# Patient Record
Sex: Female | Born: 1945 | Race: White | Hispanic: No | Marital: Married | Smoking: Never smoker
Health system: Southern US, Community
[De-identification: ages and names within clinical notes are randomized; demographics above are authoritative.]

## PROBLEM LIST (undated history)

## (undated) DIAGNOSIS — E785 Hyperlipidemia, unspecified: Secondary | ICD-10-CM

## (undated) DIAGNOSIS — R202 Paresthesia of skin: Secondary | ICD-10-CM

## (undated) DIAGNOSIS — M359 Systemic involvement of connective tissue, unspecified: Secondary | ICD-10-CM

## (undated) DIAGNOSIS — F329 Major depressive disorder, single episode, unspecified: Secondary | ICD-10-CM

## (undated) DIAGNOSIS — M3130 Wegener's granulomatosis without renal involvement: Secondary | ICD-10-CM

## (undated) DIAGNOSIS — G2581 Restless legs syndrome: Secondary | ICD-10-CM

## (undated) DIAGNOSIS — R200 Anesthesia of skin: Secondary | ICD-10-CM

## (undated) DIAGNOSIS — N189 Chronic kidney disease, unspecified: Secondary | ICD-10-CM

## (undated) DIAGNOSIS — K635 Polyp of colon: Secondary | ICD-10-CM

## (undated) DIAGNOSIS — I10 Essential (primary) hypertension: Secondary | ICD-10-CM

## (undated) DIAGNOSIS — M1990 Unspecified osteoarthritis, unspecified site: Secondary | ICD-10-CM

## (undated) DIAGNOSIS — Z8489 Family history of other specified conditions: Secondary | ICD-10-CM

## (undated) DIAGNOSIS — R269 Unspecified abnormalities of gait and mobility: Secondary | ICD-10-CM

## (undated) DIAGNOSIS — E7800 Pure hypercholesterolemia, unspecified: Secondary | ICD-10-CM

## (undated) DIAGNOSIS — I776 Arteritis, unspecified: Principal | ICD-10-CM

## (undated) DIAGNOSIS — J069 Acute upper respiratory infection, unspecified: Secondary | ICD-10-CM

## (undated) DIAGNOSIS — Z7409 Other reduced mobility: Secondary | ICD-10-CM

## (undated) DIAGNOSIS — F32A Depression, unspecified: Secondary | ICD-10-CM

## (undated) DIAGNOSIS — G63 Polyneuropathy in diseases classified elsewhere: Secondary | ICD-10-CM

## (undated) HISTORY — DX: Pure hypercholesterolemia, unspecified: E78.00

## (undated) HISTORY — PX: POLYPECTOMY: SHX149

## (undated) HISTORY — DX: Hyperlipidemia, unspecified: E78.5

## (undated) HISTORY — DX: Arteritis, unspecified: I77.6

## (undated) HISTORY — DX: Systemic involvement of connective tissue, unspecified: M35.9

## (undated) HISTORY — DX: Polyneuropathy in diseases classified elsewhere: G63

## (undated) HISTORY — DX: Depression, unspecified: F32.A

## (undated) HISTORY — DX: Wegener's granulomatosis without renal involvement: M31.30

## (undated) HISTORY — DX: Polyp of colon: K63.5

## (undated) HISTORY — DX: Unspecified abnormalities of gait and mobility: R26.9

## (undated) HISTORY — DX: Chronic kidney disease, unspecified: N18.9

## (undated) HISTORY — DX: Essential (primary) hypertension: I10

## (undated) HISTORY — PX: COLONOSCOPY: SHX174

## (undated) HISTORY — DX: Major depressive disorder, single episode, unspecified: F32.9

---

## 1997-11-15 ENCOUNTER — Other Ambulatory Visit: Admission: RE | Admit: 1997-11-15 | Discharge: 1997-11-15 | Payer: Self-pay | Admitting: Obstetrics and Gynecology

## 1999-02-18 ENCOUNTER — Other Ambulatory Visit: Admission: RE | Admit: 1999-02-18 | Discharge: 1999-02-18 | Payer: Self-pay | Admitting: Family Medicine

## 2002-05-23 ENCOUNTER — Other Ambulatory Visit: Admission: RE | Admit: 2002-05-23 | Discharge: 2002-05-23 | Payer: Self-pay | Admitting: Obstetrics and Gynecology

## 2003-08-06 ENCOUNTER — Other Ambulatory Visit: Admission: RE | Admit: 2003-08-06 | Discharge: 2003-08-06 | Payer: Self-pay | Admitting: Obstetrics & Gynecology

## 2005-02-11 ENCOUNTER — Other Ambulatory Visit: Admission: RE | Admit: 2005-02-11 | Discharge: 2005-02-11 | Payer: Self-pay | Admitting: Radiology

## 2005-07-02 ENCOUNTER — Ambulatory Visit (HOSPITAL_BASED_OUTPATIENT_CLINIC_OR_DEPARTMENT_OTHER): Admission: RE | Admit: 2005-07-02 | Discharge: 2005-07-02 | Payer: Self-pay | Admitting: Surgery

## 2005-07-02 ENCOUNTER — Encounter (INDEPENDENT_AMBULATORY_CARE_PROVIDER_SITE_OTHER): Payer: Self-pay | Admitting: *Deleted

## 2006-09-06 ENCOUNTER — Ambulatory Visit: Payer: Self-pay | Admitting: Internal Medicine

## 2006-09-30 ENCOUNTER — Ambulatory Visit: Payer: Self-pay | Admitting: Internal Medicine

## 2006-09-30 ENCOUNTER — Encounter: Payer: Self-pay | Admitting: Internal Medicine

## 2010-10-03 NOTE — Op Note (Signed)
NAME:  Kristie Porter, Kristie Porter               ACCOUNT NO.:  [id number]   MEDICAL RECORD NO.:  [id number]          PATIENT TYPE:  AMB   LOCATION:  DSC                          FACILITY:  [facility]   PHYSICIAN:  Currie Paris, M.D.DATE OF BIRTH:  [birth year]   DATE OF PROCEDURE:  07/02/2005  DATE OF DISCHARGE:                                 OPERATIVE REPORT   PREOPERATIVE DIAGNOSIS:  Left breast cyst with some atypia on aspiration.   POSTOPERATIVE DIAGNOSIS:  Left breast cyst with some atypia on aspiration.   DESCRIPTION OF PROCEDURE:  Excision of left breast cyst.   SURGEON:  Currie Paris, M.D.   ANESTHESIA:  General.   CLINICAL HISTORY:  This is a 65 year old who had her left breast cyst just  aspirated and it subsequently reaccumulated. An FNA had shown some apical  metaplasia with cytologic atypia. Excisional biopsy was recommended.   DESCRIPTION OF PROCEDURE:  The patient was seen in the holding area and  shehad no further questions. She was taken to the operating room, and the  mass was identified both by palpation and by ultrasound. It was marked. She  then underwent satisfactory general anesthesia.   The breast was prepped and draped. The time-out occurred.   I injected some Marcaine to help with postoperative analgesia. I made an  incision from about the 10 o'clock to the 12 o'clock position, about a  centimeter above the areolar margin. I divided a little of the subcutaneous  tissue, and by palpation started to go around the mass superiorly, then a  little bit laterally and a little bit medially. Unfortunately the mass  disrupted with a little bit of pressure, and I suctioned out some old  brownish fluid. The mass was then excised to get the entire cyst wall; this  had to be somewhat fragmented, because once the cyst disrupted it was  difficult to get good grasp on it and do it by palpation. Nevertheless, I  got it completely out and I felt like all the edges of the  tissue left  behind were all normal breast tissue and fatty tissue.   I injected some more Marcaine deep. I made sure everything was dry. I then  closed in layers with several layers of 3-0 Vicryl, and finally 4-0 Monocryl  subcuticular plus Dermabond.   The patient tolerated the procedure well. There were no operative  complications and all counts were correct.      Currie Paris, M.D.  Electronically Signed     CJS/MEDQ  D:  07/02/2005  T:  07/02/2005  Job:  [id number]   cc:   Erskine Speed, M.D.  Fax: [phone]   Jasmine Pang  Fax: [phone]

## 2010-12-24 ENCOUNTER — Other Ambulatory Visit: Payer: Self-pay | Admitting: Internal Medicine

## 2010-12-24 DIAGNOSIS — Z780 Asymptomatic menopausal state: Secondary | ICD-10-CM

## 2011-01-20 ENCOUNTER — Other Ambulatory Visit: Payer: Self-pay

## 2011-02-16 ENCOUNTER — Ambulatory Visit
Admission: RE | Admit: 2011-02-16 | Discharge: 2011-02-16 | Disposition: A | Discharge status: Home / Self Care | Payer: BC Managed Care – PPO | Source: Ambulatory Visit | Attending: Internal Medicine | Admitting: Internal Medicine

## 2011-02-16 DIAGNOSIS — Z780 Asymptomatic menopausal state: Secondary | ICD-10-CM

## 2013-03-13 ENCOUNTER — Other Ambulatory Visit: Payer: Self-pay | Admitting: Internal Medicine

## 2013-03-13 ENCOUNTER — Ambulatory Visit
Admission: RE | Admit: 2013-03-13 | Discharge: 2013-03-13 | Disposition: A | Discharge status: Home / Self Care | Payer: Medicare Other | Source: Ambulatory Visit | Attending: Internal Medicine | Admitting: Internal Medicine

## 2013-03-13 DIAGNOSIS — R52 Pain, unspecified: Secondary | ICD-10-CM

## 2013-08-03 ENCOUNTER — Encounter: Payer: Self-pay | Admitting: Internal Medicine

## 2014-05-18 DIAGNOSIS — J069 Acute upper respiratory infection, unspecified: Secondary | ICD-10-CM

## 2014-05-18 HISTORY — DX: Acute upper respiratory infection, unspecified: J06.9

## 2014-07-16 ENCOUNTER — Other Ambulatory Visit: Payer: Self-pay | Admitting: Internal Medicine

## 2014-07-16 DIAGNOSIS — H938X3 Other specified disorders of ear, bilateral: Secondary | ICD-10-CM

## 2014-07-16 DIAGNOSIS — H9193 Unspecified hearing loss, bilateral: Secondary | ICD-10-CM

## 2014-07-17 ENCOUNTER — Ambulatory Visit
Admission: RE | Admit: 2014-07-17 | Discharge: 2014-07-17 | Disposition: A | Discharge status: Home / Self Care | Payer: Medicare Other | Source: Ambulatory Visit | Attending: Internal Medicine | Admitting: Internal Medicine

## 2014-07-17 DIAGNOSIS — H938X3 Other specified disorders of ear, bilateral: Secondary | ICD-10-CM

## 2014-07-17 DIAGNOSIS — H9193 Unspecified hearing loss, bilateral: Secondary | ICD-10-CM

## 2014-09-16 DIAGNOSIS — Z7409 Other reduced mobility: Secondary | ICD-10-CM

## 2014-09-16 HISTORY — DX: Other reduced mobility: Z74.09

## 2014-09-26 ENCOUNTER — Inpatient Hospital Stay (HOSPITAL_COMMUNITY)
Admission: AD | Admit: 2014-09-26 | Discharge: 2014-10-03 | DRG: 042 | Disposition: A | Discharge status: Skilled Nursing Facility | Payer: Medicare Other | Source: Ambulatory Visit | Attending: Internal Medicine | Admitting: Internal Medicine

## 2014-09-26 ENCOUNTER — Encounter (HOSPITAL_COMMUNITY): Payer: Self-pay | Admitting: General Practice

## 2014-09-26 ENCOUNTER — Inpatient Hospital Stay (HOSPITAL_COMMUNITY): Payer: Medicare Other

## 2014-09-26 DIAGNOSIS — R29898 Other symptoms and signs involving the musculoskeletal system: Secondary | ICD-10-CM | POA: Diagnosis not present

## 2014-09-26 DIAGNOSIS — R599 Enlarged lymph nodes, unspecified: Secondary | ICD-10-CM

## 2014-09-26 DIAGNOSIS — R209 Unspecified disturbances of skin sensation: Secondary | ICD-10-CM

## 2014-09-26 DIAGNOSIS — D649 Anemia, unspecified: Secondary | ICD-10-CM | POA: Diagnosis present

## 2014-09-26 DIAGNOSIS — M25531 Pain in right wrist: Secondary | ICD-10-CM | POA: Diagnosis present

## 2014-09-26 DIAGNOSIS — G629 Polyneuropathy, unspecified: Principal | ICD-10-CM | POA: Diagnosis present

## 2014-09-26 DIAGNOSIS — T380X5A Adverse effect of glucocorticoids and synthetic analogues, initial encounter: Secondary | ICD-10-CM | POA: Diagnosis present

## 2014-09-26 DIAGNOSIS — G2581 Restless legs syndrome: Secondary | ICD-10-CM | POA: Diagnosis present

## 2014-09-26 DIAGNOSIS — R531 Weakness: Secondary | ICD-10-CM | POA: Diagnosis present

## 2014-09-26 DIAGNOSIS — R208 Other disturbances of skin sensation: Secondary | ICD-10-CM | POA: Diagnosis not present

## 2014-09-26 DIAGNOSIS — I10 Essential (primary) hypertension: Secondary | ICD-10-CM | POA: Diagnosis present

## 2014-09-26 DIAGNOSIS — R591 Generalized enlarged lymph nodes: Secondary | ICD-10-CM

## 2014-09-26 DIAGNOSIS — G609 Hereditary and idiopathic neuropathy, unspecified: Secondary | ICD-10-CM | POA: Diagnosis not present

## 2014-09-26 DIAGNOSIS — R292 Abnormal reflex: Secondary | ICD-10-CM

## 2014-09-26 HISTORY — DX: Acute upper respiratory infection, unspecified: J06.9

## 2014-09-26 HISTORY — DX: Anesthesia of skin: R20.2

## 2014-09-26 HISTORY — DX: Unspecified osteoarthritis, unspecified site: M19.90

## 2014-09-26 HISTORY — DX: Family history of other specified conditions: Z84.89

## 2014-09-26 HISTORY — DX: Restless legs syndrome: G25.81

## 2014-09-26 HISTORY — DX: Anesthesia of skin: R20.0

## 2014-09-26 HISTORY — DX: Other reduced mobility: Z74.09

## 2014-09-26 LAB — COMPREHENSIVE METABOLIC PANEL
ALBUMIN: 2.4 g/dL — AB (ref 3.5–5.0)
ALT: 24 U/L (ref 14–54)
AST: 63 U/L — AB (ref 15–41)
Alkaline Phosphatase: 114 U/L (ref 38–126)
Anion gap: 13 (ref 5–15)
BUN: 14 mg/dL (ref 6–20)
CALCIUM: 9 mg/dL (ref 8.9–10.3)
CO2: 27 mmol/L (ref 22–32)
Chloride: 97 mmol/L — ABNORMAL LOW (ref 101–111)
Creatinine, Ser: 0.94 mg/dL (ref 0.44–1.00)
GFR calc Af Amer: >60 mL/min (ref >60)
Glucose, Bld: 136 mg/dL — ABNORMAL HIGH (ref 70–99)
Potassium: 4.4 mmol/L (ref 3.5–5.1)
Sodium: 137 mmol/L (ref 135–145)
Total Bilirubin: 0.7 mg/dL (ref 0.3–1.2)
Total Protein: 7.4 g/dL (ref 6.5–8.1)

## 2014-09-26 LAB — CBC WITH DIFFERENTIAL/PLATELET
BASOS ABS: 0 10*3/uL (ref 0.0–0.1)
Basophils Relative: 0 % (ref 0–1)
EOS PCT: 0 % (ref 0–5)
Eosinophils Absolute: 0 10*3/uL (ref 0.0–0.7)
HEMATOCRIT: 32.7 % — AB (ref 36.0–46.0)
Hemoglobin: 10.2 g/dL — ABNORMAL LOW (ref 12.0–15.0)
LYMPHS PCT: 13 % (ref 12–46)
Lymphs Abs: 1.3 10*3/uL (ref 0.7–4.0)
MCH: 25.7 pg — ABNORMAL LOW (ref 26.0–34.0)
MCHC: 31.2 g/dL (ref 30.0–36.0)
MCV: 82.4 fL (ref 78.0–100.0)
Monocytes Absolute: 0.4 10*3/uL (ref 0.1–1.0)
Monocytes Relative: 4 % (ref 3–12)
NEUTROS ABS: 7.8 10*3/uL — AB (ref 1.7–7.7)
Neutrophils Relative %: 83 % — ABNORMAL HIGH (ref 43–77)
Platelets: 455 10*3/uL — ABNORMAL HIGH (ref 150–400)
RBC: 3.97 MIL/uL (ref 3.87–5.11)
RDW: 14.6 % (ref 11.5–15.5)
WBC: 9.4 10*3/uL (ref 4.0–10.5)

## 2014-09-26 LAB — SEDIMENTATION RATE: Sed Rate: 105 mm/hr — ABNORMAL HIGH (ref 0–22)

## 2014-09-26 LAB — VITAMIN B12: Vitamin B-12: 258 pg/mL (ref 180–914)

## 2014-09-26 LAB — C-REACTIVE PROTEIN: CRP: 32.3 mg/dL — AB (ref <1.0)

## 2014-09-26 LAB — TSH: TSH: 1.279 u[IU]/mL (ref 0.350–4.500)

## 2014-09-26 LAB — CK: Total CK: 1997 U/L — ABNORMAL HIGH (ref 38–234)

## 2014-09-26 MED ORDER — GABAPENTIN 600 MG PO TABS
300.0000 mg | ORAL_TABLET | Freq: Three times a day (TID) | ORAL | Status: DC | PRN
  Administered 2014-09-29: 300 mg via ORAL
  Filled 2014-09-26: qty 1

## 2014-09-26 MED ORDER — GABAPENTIN 300 MG PO CAPS
300.0000 mg | ORAL_CAPSULE | Freq: Three times a day (TID) | ORAL | Status: DC
  Administered 2014-09-26 – 2014-09-30 (×12): 300 mg via ORAL
  Filled 2014-09-26 (×11): qty 1

## 2014-09-26 MED ORDER — ACETAMINOPHEN 650 MG RE SUPP: 650.0000 mg | Freq: Four times a day (QID) | RECTAL | Status: DC | PRN

## 2014-09-26 MED ORDER — HYDROCODONE-ACETAMINOPHEN 5-325 MG PO TABS
1.0000 | ORAL_TABLET | ORAL | Status: DC | PRN
  Administered 2014-09-26 – 2014-10-03 (×29): 2 via ORAL
  Filled 2014-09-26 (×30): qty 2

## 2014-09-26 MED ORDER — ONDANSETRON HCL 4 MG PO TABS: 4.0000 mg | ORAL_TABLET | Freq: Four times a day (QID) | ORAL | Status: DC | PRN

## 2014-09-26 MED ORDER — ONDANSETRON HCL 4 MG/2ML IJ SOLN: 4.0000 mg | Freq: Four times a day (QID) | INTRAMUSCULAR | Status: DC | PRN

## 2014-09-26 MED ORDER — ENOXAPARIN SODIUM 40 MG/0.4ML ~~LOC~~ SOLN
40.0000 mg | SUBCUTANEOUS | Status: DC
  Administered 2014-09-26 – 2014-10-02 (×7): 40 mg via SUBCUTANEOUS
  Filled 2014-09-26 (×8): qty 0.4

## 2014-09-26 MED ORDER — ACETAMINOPHEN 325 MG PO TABS: 650.0000 mg | ORAL_TABLET | Freq: Four times a day (QID) | ORAL | Status: DC | PRN
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
## 2014-09-26 NOTE — Progress Notes (Signed)
Patient present on floor. No orders written as of yet. Will continue to monitor.
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
## 2014-09-27 ENCOUNTER — Inpatient Hospital Stay (HOSPITAL_COMMUNITY): Payer: Medicare Other

## 2014-09-27 LAB — PROTEIN ELECTROPHORESIS, SERUM
A/G RATIO SPE: 0.6 — AB (ref 0.7–2.0)
ALBUMIN ELP: 2.5 g/dL — AB (ref 3.2–5.6)
Alpha-1-Globulin: 0.7 g/dL — ABNORMAL HIGH (ref 0.1–0.4)
Alpha-2-Globulin: 1.4 g/dL — ABNORMAL HIGH (ref 0.4–1.2)
BETA GLOBULIN: 1.6 g/dL — AB (ref 0.6–1.3)
GAMMA GLOBULIN: 0.6 g/dL (ref 0.5–1.6)
GLOBULIN, TOTAL: 4.4 g/dL (ref 2.0–4.5)
M-SPIKE, %: 0.8 g/dL — AB
Total Protein ELP: 6.9 g/dL (ref 6.0–8.5)

## 2014-09-27 LAB — CK TOTAL AND CKMB (NOT AT ARMC)
CK TOTAL: 656 U/L — AB (ref 38–234)
CK TOTAL: 510 U/L — AB (ref 38–234)
CK, MB: 4.1 ng/mL (ref 0.5–5.0)
CK, MB: 3.4 ng/mL (ref 0.5–5.0)
Relative Index: 0.6 (ref 0.0–2.5)
Relative Index: 0.7 (ref 0.0–2.5)

## 2014-09-27 LAB — RPR: RPR Ser Ql: NONREACTIVE

## 2014-09-27 LAB — SJOGRENS SYNDROME-B EXTRACTABLE NUCLEAR ANTIBODY: SSB (LA) (ENA) ANTIBODY, IGG: NEGATIVE

## 2014-09-27 LAB — ANGIOTENSIN CONVERTING ENZYME: Angiotensin-Converting Enzyme: 41 U/L (ref 14–82)

## 2014-09-27 LAB — SJOGRENS SYNDROME-A EXTRACTABLE NUCLEAR ANTIBODY: SSA (RO) (ENA) ANTIBODY, IGG: NEGATIVE

## 2014-09-27 LAB — HIV ANTIBODY (ROUTINE TESTING W REFLEX): HIV SCREEN 4TH GENERATION: NONREACTIVE

## 2014-09-27 LAB — FOLATE RBC
FOLATE, HEMOLYSATE: 333.7 ng/mL
Folate, RBC: 1024 ng/mL (ref >498)
HEMATOCRIT: 32.6 % — AB (ref 34.0–46.6)

## 2014-09-27 LAB — CERULOPLASMIN: Ceruloplasmin: 50.8 mg/dL — ABNORMAL HIGH (ref 19.0–39.0)

## 2014-09-27 MED ORDER — SODIUM CHLORIDE 0.9 % IV SOLN
INTRAVENOUS | Status: DC
  Administered 2014-09-27 – 2014-09-29 (×5): via INTRAVENOUS
  Administered 2014-09-30 (×2): 1 mL via INTRAVENOUS
  Administered 2014-09-30: 11:00:00 via INTRAVENOUS
  Administered 2014-10-01: 1 mL via INTRAVENOUS

## 2014-09-27 MED ORDER — VITAMIN B-12 1000 MCG PO TABS
1000.0000 ug | ORAL_TABLET | Freq: Every day | ORAL | Status: DC
  Administered 2014-09-27 – 2014-10-03 (×7): 1000 ug via ORAL
  Filled 2014-09-27 (×7): qty 1

## 2014-09-27 MED ORDER — METHOCARBAMOL 500 MG PO TABS
500.0000 mg | ORAL_TABLET | Freq: Three times a day (TID) | ORAL | Status: DC | PRN
  Administered 2014-10-01 – 2014-10-03 (×4): 500 mg via ORAL
  Filled 2014-09-27 (×4): qty 1

## 2014-09-27 NOTE — Progress Notes (Signed)
Subjective: No significant changes overnight.   Exam: Filed Vitals:   09/27/14 0541  BP: 125/75  Pulse: 106  Temp: 98.8 F (37.1 C)  Resp: 16   Gen: In bed, NAD MS: awake, alert, interactive and appropriate VF:IEPP, VFF Motor: distal weakness essentially uncahnged Sensory: decreased below the midfoot.  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 No signs of myeloppathic process on MRI, but this would not rule out metabolic myelopathies. Her B12 is low normal, will add MMA and then replete. Time course and exam does not seem consistent with AIDP. Her elevated CK could be indicative of a myopathic process as well as a neuropathic one.   Recommendations: 1) MMA  2) ANCA 3) f/u pending studies  Roland Rack, MD [facility] [phone]  If 7pm- 7am, please page neurology on call as listed in [place].

## 2014-09-27 NOTE — Care Management Note (Signed)
Case Management Note  Patient Details  Name: Kristie Porter MRN: [id number] Date of Birth: 03/10/1946  Subjective/Objective:                    Action/Plan: UR completed  Expected Discharge Date:                  Expected Discharge Plan:  [place]  In-House Referral:     Discharge planning Services     Post Acute Care Choice:    Choice offered to:  Patient  DME Arranged:    DME Agency:     HH Arranged:    [place]:     Status of Service:     Medicare Important Message Given:    Date Medicare IM Given:  09/27/14 Medicare IM give by:  Magdalen Spatz RN BSN  Date Additional Medicare IM Given:    Additional Medicare Important Message give by:     If discussed at [place] of Stay Meetings, dates discussed:    Additional Comments:  Marilu Favre, RN 09/27/2014, 2:24 PM
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
## 2014-09-27 NOTE — Progress Notes (Signed)
Occupational Therapy Evaluation Patient Details Name: Kristie Porter MRN: [id number] DOB: 10-25-1945 Today's Date: 09/27/2014    History of Present Illness 69 y.o. female admitted with bilateral decreased sensation and weakness of LEs.   Clinical Impression   Patient independent 8 weeks PTA. Patient currently functioning at up to a total assist level for ADLs and min assist for functional transfers and mobility. Patient will benefit from acute OT to increase overall independence in the areas of ADLs, functional mobility, and overall safety in order to safely discharge to venue listed below. Patient states she feel like she is getting stronger, but patient continues to be limited by decreased sensation, proprioception, motor control, and strength. As of now, patient states her goal is to have therapy educate her family so they can best help her once she goes home. Believe patient will benefit from skilled interdisciplinary CIR prior to d/c>home.     Follow Up Recommendations  CIR;Supervision/Assistance - 24 hour    Equipment Recommendations  3 in 1 bedside comode    Recommendations for Other Services Rehab consult   Precautions / Restrictions Precautions Precautions: Fall Restrictions Weight Bearing Restrictions: No    Mobility Bed Mobility Overal bed mobility: Needs Assistance Bed Mobility: Rolling;Sidelying to Sit;Sit to Supine Rolling: Min guard Sidelying to sit: Min guard;HOB elevated   Sit to supine: Min guard;HOB elevated   General bed mobility comments: Min guard for safety. Cues for technique. Patient with difficulty using RUE during bed mobility.   Transfers  General transfer comment: No transfer occurred during OT eval, please see PT eval for more information. Patient aprehensive and anxious about performing transfers.     Balance Overall balance assessment: Needs assistance Sitting-balance support: No upper extremity supported;Feet supported Sitting balance-Leahy  Scale: Fair      ADL Overall ADL's : Needs assistance/impaired Eating/Feeding: Minimal assistance;Sitting Eating/Feeding Details (indicate cue type and reason): administerred red built up foam to increase independence with this Grooming: Maximal assistance;Sitting Grooming Details (indicate cue type and reason): red built up foam to assist with this as well Upper Body Bathing: Minimal assitance;Sitting   Lower Body Bathing: Total assistance;Sit to/from stand;Cueing for safety   Upper Body Dressing : Moderate assistance;Sitting   Lower Body Dressing: Total assistance;Sit to/from stand;Cueing for safety  General ADL Comments: Patient with decreased sensation, proprioception, strength throughout BLEs and RUE. This limits patient's and patient's ability to be more independent. Patient anxious and aprehensive about mobility/transfers. Patient with supportive family.     Pertinent Vitals/Pain Pain Assessment: Faces Faces Pain Scale: Hurts even more Pain Location: right hand with movement and during MMT testing Pain Descriptors / Indicators: Grimacing Pain Intervention(s): Monitored during session;Repositioned     Hand Dominance Right   Extremity/Trunk Assessment Upper Extremity Assessment Upper Extremity Assessment: Generalized weakness;RUE deficits/detail RUE Deficits / Details: decreased overall strength biceps> distally. increased pain during MMT RUE Sensation: decreased light touch (wrist > fingers) RUE Coordination: decreased fine motor;decreased gross motor   Lower Extremity Assessment Lower Extremity Assessment: Defer to PT evaluation   Cervical / Trunk Assessment Cervical / Trunk Assessment: Normal   Communication Communication Communication: No difficulties   Cognition Arousal/Alertness: Awake/alert Behavior During Therapy: WFL for tasks assessed/performed Overall Cognitive Status: Within Functional Limits for tasks assessed               Home Living  Family/patient expects to be discharged to:: Private residence Living Arrangements: Spouse/significant other Available Help at Discharge: Family;Available 24 hours/day Type of Home: House Home Access: Stairs to  enter Entrance Stairs-Number of Steps: 3 Entrance Stairs-Rails: None Home Layout: Two level;Able to live on main level with bedroom/bathroom     Bathroom Shower/Tub: Walk-in shower;Door         Home Equipment: Kasandra Knudsen - single point;Walker - 4 wheels;Shower seat   Prior Functioning/Environment Level of Independence: Needs assistance  Gait / Transfers Assistance Needed: Independent 8 weeks ago prior to URI. Since then, has had a progressive decline and has not been ambulating without RW and assist in the past couple of weeks ADL's / Homemaking Assistance Needed: Independent prior to 8 weeks ago when diagnosed with URI. Since then has required increased assist with ADLs including bath/dress.    OT Diagnosis: Generalized weakness;Acute pain   OT Problem List: Decreased strength;Decreased range of motion;Decreased activity tolerance;Impaired balance (sitting and/or standing);Decreased coordination;Decreased safety awareness;Decreased knowledge of use of DME or AE;Pain   OT Treatment/Interventions: Self-care/ADL training;Therapeutic exercise;Neuromuscular education;Energy conservation;Patient/family education;Balance training;Therapeutic activities;DME and/or AE instruction    OT Goals(Current goals can be found in the care plan section) Acute Rehab OT Goals Patient Stated Goal: my family to be educated on how to best help me OT Goal Formulation: With patient/family Time For Goal Achievement: 10/04/14 Potential to Achieve Goals: Good ADL Goals Pt Will Perform Grooming: with set-up;sitting (unsupported using RUE) Pt Will Perform Upper Body Bathing: with set-up;sitting (unsupported) Pt Will Perform Lower Body Bathing: with min assist;sit to/from stand;with adaptive equipment Pt Will  Perform Upper Body Dressing: with set-up;sitting (unsupported) Pt Will Perform Lower Body Dressing: with min assist;sit to/from stand;with adaptive equipment Pt Will Transfer to Toilet: with min assist;bedside commode;ambulating Pt Will Perform Toileting - Clothing Manipulation and hygiene: with min assist;sit to/from stand Pt Will Perform Tub/Shower Transfer: Shower transfer;with min guard assist;anterior/posterior transfer;shower seat;ambulating  OT Frequency: Min 2X/week   Barriers to D/C: None known at this time          End of Session Nurse Communication: Other (comment) (adminsterred red built up foam for self-feeding and grooming tasks)  Activity Tolerance: Other (comment) (pt limited by anxiety) Patient left: in bed;with call bell/phone within reach;with family/visitor present   Time: 1275-1700 OT Time Calculation (min): 25 min Charges:  OT General Charges $OT Visit: 1 Procedure OT Evaluation $Initial OT Evaluation Tier I: 1 Procedure OT Treatments $Self Care/Home Management : 8-22 mins Myrla Malanowski , MS, OTR/L, CLT Pager: [phone]  09/27/2014, 5:03 PM

## 2014-09-28 ENCOUNTER — Other Ambulatory Visit: Payer: Self-pay | Admitting: Neurosurgery

## 2014-09-28 DIAGNOSIS — R29898 Other symptoms and signs involving the musculoskeletal system: Secondary | ICD-10-CM

## 2014-09-28 LAB — COMPREHENSIVE METABOLIC PANEL
ALT: 21 U/L (ref 14–54)
AST: 40 U/L (ref 15–41)
Albumin: 1.9 g/dL — ABNORMAL LOW (ref 3.5–5.0)
Alkaline Phosphatase: 113 U/L (ref 38–126)
Anion gap: 10 (ref 5–15)
BILIRUBIN TOTAL: 0.5 mg/dL (ref 0.3–1.2)
BUN: 13 mg/dL (ref 6–20)
CALCIUM: 8.1 mg/dL — AB (ref 8.9–10.3)
CHLORIDE: 101 mmol/L (ref 101–111)
CO2: 24 mmol/L (ref 22–32)
CREATININE: 0.95 mg/dL (ref 0.44–1.00)
GFR calc Af Amer: >60 mL/min (ref >60)
GFR calc non Af Amer: >60 mL/min (ref >60)
Glucose, Bld: 103 mg/dL — ABNORMAL HIGH (ref 65–99)
Potassium: 3.9 mmol/L (ref 3.5–5.1)
Sodium: 135 mmol/L (ref 135–145)
Total Protein: 5.7 g/dL — ABNORMAL LOW (ref 6.5–8.1)

## 2014-09-28 LAB — MPO/PR-3 (ANCA) ANTIBODIES
ANCA Proteinase 3: 36.7 U/mL — ABNORMAL HIGH (ref 0.0–3.5)
Myeloperoxidase Abs: <9 U/mL (ref 0.0–9.0)

## 2014-09-28 LAB — CK TOTAL AND CKMB (NOT AT ARMC)
CK, MB: 2.5 ng/mL (ref 0.5–5.0)
RELATIVE INDEX: 0.7 (ref 0.0–2.5)
Total CK: 342 U/L — ABNORMAL HIGH (ref 38–234)

## 2014-09-28 LAB — HEMOGLOBIN A1C
Hgb A1c MFr Bld: 5.7 % — ABNORMAL HIGH (ref 4.8–5.6)
Mean Plasma Glucose: 117 mg/dL

## 2014-09-28 LAB — HEAVY METALS, BLOOD: Mercury: <4 mcg/L (ref <=10)

## 2014-09-28 LAB — COPPER, SERUM: COPPER: 181 ug/dL — AB (ref 72–166)

## 2014-09-28 MED ORDER — SODIUM CHLORIDE 0.9 % IV SOLN
500.0000 mg | Freq: Two times a day (BID) | INTRAVENOUS | Status: AC
  Administered 2014-09-28 – 2014-10-01 (×6): 500 mg via INTRAVENOUS
  Filled 2014-09-28 (×6): qty 4

## 2014-09-28 NOTE — Progress Notes (Signed)
Physical Therapy Treatment Patient Details Name: Kristie Porter Needs MRN: [id number] DOB: [birth year] Today's Date: 09/28/2014    History of Present Illness 69 y.o. female admitted with bilateral decreased sensation and weakness of LEs.    PT Comments    Patient is self-limiting today. Demonstrates a very significant improvement in proximal LE muscle strength, although still no distal ankle activation BIL. Performed bed mobility well and was able to stand without physical assist. Patient was able to march LEs while standing and then stated her Lt knee was sore so she wanted to get back in bed. Discussed extensively, the importance of being out of bed and working with therapy as able. Patient adamantly refuses to work on transfer training although I feel patient can physically perform this task well and even ambulate if she were willing.  Follow Up Recommendations  SNF;Supervision for mobility/OOB     Equipment Recommendations  None recommended by PT    Recommendations for Other Services       Precautions / Restrictions Precautions Precautions: Fall Restrictions Weight Bearing Restrictions: No    Mobility  Bed Mobility Overal bed mobility: Needs Assistance Bed Mobility: Sidelying to Sit;Rolling;Sit to Sidelying Rolling: Supervision Sidelying to sit: Supervision     Sit to sidelying: Supervision General bed mobility comments: Supervision for safety. Requires extra time and use of rail. Some difficulty bringing LEs back into bed but did not require assist  Transfers Overall transfer level: Needs assistance Equipment used: Rolling walker (2 wheeled) Transfers: Sit to/from Stand Sit to Stand: Min guard         General transfer comment: Min guard for safety from lowest bed setting. VC for hand placement. Able to march LEs x10 with good clearance using RW for support. Reported soreness in Lt knee and sat back onto bed. Refused to attempt transfer  Ambulation/Gait             General Gait Details: refused.   Stairs            Wheelchair Mobility    Modified Rankin (Stroke Patients Only)       Balance                                    Cognition Arousal/Alertness: Awake/alert Behavior During Therapy: WFL for tasks assessed/performed Overall Cognitive Status: Within Functional Limits for tasks assessed                      Exercises General Exercises - Lower Extremity Ankle Circles/Pumps: Both;10 reps;Seated;PROM Quad Sets: Strengthening;Both;10 reps;Supine Long Arc Quad: Strengthening;Both;10 reps;Seated Heel Slides: Both;10 reps;AROM;Strengthening;Supine Hip ABduction/ADduction: Both;10 reps;Seated;AROM;Strengthening Hip Flexion/Marching: Strengthening;Both;10 reps;Standing    General Comments General comments (skin integrity, edema, etc.): Patient self limiting. Not willing to transfer to [facility] or chair, only wants to lie in bed. Agreed to perform bed level exercises despite encouragement to be out of bed.      Pertinent Vitals/Pain Pain Assessment: Faces Faces Pain Scale: Hurts little more Pain Location: left shoulder, posterior left knee upon standing Pain Descriptors / Indicators: Sore Pain Intervention(s): Monitored during session;Repositioned    Home Living                      Prior Function            PT Goals (current goals can now be found in the care plan section) Acute Rehab PT Goals Patient  Stated Goal: None stated PT Goal Formulation: With patient Time For Goal Achievement: 10/10/14 Potential to Achieve Goals: Good Progress towards PT goals: Progressing toward goals    Frequency  Min 3X/week    PT Plan Discharge plan needs to be updated    Co-evaluation             End of Session Equipment Utilized During Treatment: Gait belt Activity Tolerance: Other (comment) (Self limiting - refuses to transfer) Patient left: with call bell/phone within reach;with family/visitor  present;in bed     Time: 6283-6629 PT Time Calculation (min) (ACUTE ONLY): 23 min  Charges:  $Therapeutic Exercise: 8-22 mins $Therapeutic Activity: 8-22 mins                    G Codes:      [name] 10/18/14, 10:18 AM Elayne Snare, [place]
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
## 2014-09-28 NOTE — Progress Notes (Signed)
Occupational Therapy Treatment Patient Details Name: Kristie Porter MRN: [id number] DOB: 21-May-1945 Today's Date: 09/28/2014    History of present illness 69 y.o. female admitted with bilateral decreased sensation and weakness of LEs.   OT comments  Pt is self-limiting and making slow progress with OT. She does report satisfaction with built up foam and increased hand strength. Pt will continue to benefit from acute OT per POC.   Follow Up Recommendations  SNF;Supervision/Assistance - 24 hour    Equipment Recommendations  3 in 1 bedside comode    Recommendations for Other Services      Precautions / Restrictions Precautions Precautions: Fall Restrictions Weight Bearing Restrictions: No       Mobility Bed Mobility Overal bed mobility: Modified Independent             General bed mobility comments: Mod I for bed mobility. Pt had no difficulty.   Transfers Overall transfer level: Needs assistance Equipment used: None Transfers: Sit to/from [facility] Sit to Stand: Min assist Stand pivot transfers: Min assist       General transfer comment: Min A, however pt could likely perform without physical assistance. However she is self-limiting and appears anxious with mobility.         ADL Overall ADL's : Needs assistance/impaired Eating/Feeding: Set up;Sitting                       Toilet Transfer: Minimal assistance;Stand-pivot Toilet Transfer Details (indicate cue type and reason): Pt likely able to do without assist but is self-limiting Toileting- Clothing Manipulation and Hygiene: Minimal assistance;Sit to/from stand         General ADL Comments: Pt reports satisfaction with use of squeeze ball and built up foam indicating independence with self-feeding. Pt reports increased grip strength. Pt agreeable to stand at EOB to facilitate [facility] transfers and discussed use of 3N1 at home.                 Cognition   Behavior During  Therapy: WFL for tasks assessed/performed Overall Cognitive Status: Within Functional Limits for tasks assessed                                    Pertinent Vitals/ Pain       Pain Assessment: 0-10 Pain Score: 4  Pain Location: left shoulder Pain Descriptors / Indicators: Sore Pain Intervention(s): Monitored during session;Repositioned         Frequency Min 2X/week     Progress Toward Goals  OT Goals(current goals can now be found in the care plan section)  Progress towards OT goals: Not progressing toward goals - comment (pt is self-limiting)     Plan Discharge plan needs to be updated       End of Session     Activity Tolerance Patient tolerated treatment well;Other (comment) (limited by anxiety; self-limiting)   Patient Left in bed;with call bell/phone within reach;with family/visitor present   Nurse Communication          Time: 0350-0938 OT Time Calculation (min): 23 min  Charges: OT General Charges $OT Visit: 1 Procedure OT Treatments $Self Care/Home Management : 23-37 mins  Juluis Rainier 09/28/2014, 5:34 PM  Secundino Ginger Lynetta Mare, OTR/L Occupational Therapist [phone] (pager)

## 2014-09-28 NOTE — Progress Notes (Signed)
Patient ID: Kristie Porter, female   DOB: [birth year], 69 y.o.   MRN: [id number] Spoke with her and family about the request by MD  For a nerve biopsy. We will schedule her for next week. She do not want local but iv sedation or general anesthesia.

## 2014-09-28 NOTE — Progress Notes (Signed)
Subjective: Feels slightly stronger in proximal strength. Now complaining of left arm pain.   Exam: Filed Vitals:   09/28/14 0533  BP: 128/50  Pulse: 93  Temp: 98.4 F (36.9 C)  Resp: 16    HEENT-  Normocephalic, no lesions, without obvious abnormality.  Normal external eye and conjunctiva.  Normal TM's bilaterally.  Normal auditory canals and external ears. Normal external nose, mucus membranes and septum.  Normal pharynx. Cardiovascular- S1, S2 normal, pulses palpable throughout   Lungs- chest clear, no wheezing, rales, normal symmetric air entry Abdomen- normal findings: bowel sounds normal Extremities- no edema Lymph-no adenopathy palpable Musculoskeletal-no joint tenderness, deformity or swelling Skin-warm and dry, no hyperpigmentation, vitiligo, or suspicious lesions  Gen: In bed, NAD MS: awake, alert, interactive and appropriate YI:RSWN, VFF, PERRLA, TML face symmetric Motor: UE  5/5 bilaterally, hip flexion 4/5 knee extension 5/5, knee flexion 3/5 and DF/PF 0/5 bilaterally Sensory: decreased below the midfoot.  DTR: absent at ankles    Pertinent Labs: ESR 105 CRP 32 CK 1997 Ceruloplasm 50.8 B12 (borderline low at 258) serum copper 181 SPEP/IFX -- SPE Interp.  Comment   Comments: (NOTE)  The SPE pattern demonstrates a single peak (M-spike) in the beta  region which may represent monoclonal protein. This peak may also  be caused by the presence of fibrinogen or circulating immune  complexes.         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   Reportedly her ANCA was positive which raises my concern considerably for a vasculitic  neuropathy. At this time, will start high dose steroids. Given the elevated CPK, it may make sense to do a nerve/muscle biopsy.    Recommendations: 1) I suggest nerve or nerve/muscle biopsy to look for evidence of inflammation.  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  3) will continue to follow.      Roland Rack, MD [facility] [phone]  If 7pm- 7am, please page neurology on call as listed in [place].  09/28/2014, 10:44 AM

## 2014-09-28 NOTE — Progress Notes (Signed)
Rehab Admissions Coordinator Note:  Patient was screened by Retta Diones for appropriateness for an Inpatient Acute Rehab Consult.  At this time, we are recommending [facility] or SNF as needed.  Patient is self limiting and it is unlikely that she would participate fully in acute inpatient rehab program.  Retta Diones 09/28/2014, 10:16 AM  I can be reached at [phone].
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
## 2014-09-29 NOTE — Progress Notes (Signed)
Subjective: Feels slightly stronger in proximal strength. Now complaining of left arm pain.   Exam: Filed Vitals:   09/29/14 0614  BP: 145/70  Pulse: 89  Temp: 97.8 F (36.6 C)  Resp: 17    HEENT-  Normocephalic, no lesions, without obvious abnormality.  Normal external eye and conjunctiva.  Normal TM's bilaterally.  Normal auditory canals and external ears. Normal external nose, mucus membranes and septum.  Normal pharynx. Cardiovascular- S1, S2 normal, pulses palpable throughout   Lungs- chest clear, no wheezing, rales, normal symmetric air entry Abdomen- normal findings: bowel sounds normal Extremities- no edema Lymph-no adenopathy palpable Musculoskeletal-no joint tenderness, deformity or swelling Skin-warm and dry, no hyperpigmentation, vitiligo, or suspicious lesions  Gen: In bed, NAD MS: awake, alert, interactive and appropriate IT:GPQD, VFF, PERRLA, TML face symmetric Motor: UE  5/5 bilaterally, hip flexion 4/5 knee extension 5/5, knee flexion 3/5 and DF/PF 0/5 bilaterally Sensory: decreased below the midfoot.  DTR: absent at ankles    Pertinent Labs: ESR 105 CRP 32 CK 1997 Ceruloplasm 50.8 B12 (borderline low at 258) serum copper 181 SPEP/IFX -- SPE Interp.  Comment   Comments: (NOTE)  The SPE pattern demonstrates a single peak (M-spike) in the beta  region which may represent monoclonal protein. This peak may also  be caused by the presence of fibrinogen or circulating immune  complexes.         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  Her ANCA was positive which raises my concern considerably for a vasculitic neuropathy. At this time, she has been started on high dose steroids. Given the elevated CPK, it may make  sense to do a nerve/muscle biopsy.    Recommendations: 1) I suggest nerve or nerve/muscle biopsy to look for evidence of inflammation. (possibly sural and gastroc?) 2) solumedrol $RemoveBefor'500mg'[id number]$  BID x 3 days then with daily dose following 3) will continue to follow.      Roland Rack, MD [facility] [phone]  If 7pm- 7am, please page neurology on call as listed in [place].  09/29/2014, 11:59 AM

## 2014-09-30 LAB — METHYLMALONIC ACID, SERUM: Methylmalonic Acid, Quantitative: 241 nmol/L (ref 0–378)

## 2014-09-30 MED ORDER — CIPROFLOXACIN-DEXAMETHASONE 0.3-0.1 % OT SUSP
2.0000 [drp] | Freq: Two times a day (BID) | OTIC | Status: DC
  Administered 2014-09-30: 2 [drp] via OTIC
  Filled 2014-09-30: qty 7.5

## 2014-09-30 MED ORDER — FLUTICASONE PROPIONATE 50 MCG/ACT NA SUSP
2.0000 | Freq: Every day | NASAL | Status: DC
  Administered 2014-09-30 – 2014-10-03 (×4): 2 via NASAL
  Filled 2014-09-30: qty 16

## 2014-09-30 MED ORDER — SALINE SPRAY 0.65 % NA SOLN
1.0000 | Freq: Four times a day (QID) | NASAL | Status: DC
  Administered 2014-09-30 – 2014-10-03 (×7): 1 via NASAL
  Filled 2014-09-30: qty 44

## 2014-09-30 MED ORDER — GABAPENTIN 300 MG PO CAPS
600.0000 mg | ORAL_CAPSULE | Freq: Three times a day (TID) | ORAL | Status: DC
  Administered 2014-09-30 – 2014-10-03 (×9): 600 mg via ORAL
  Filled 2014-09-30 (×10): qty 2

## 2014-09-30 NOTE — Progress Notes (Signed)
Patient ID: Kristie Porter, female   DOB: [birth year], 70 y.o.   MRN: [id number] For sural nerve biopsy on Tuesday. Procedure explained to her

## 2014-09-30 NOTE — Progress Notes (Signed)
Patient ID: Kristie Porter, female   DOB: 09/10/1945, 69 y.o.   MRN: [id number] Spoke with neurology. They want to add a muscle biopsy besides the nerve. Spoke with her and husband

## 2014-09-30 NOTE — Progress Notes (Signed)
Subjective: Feels slightly stronger in arms and left leg.   Exam: Filed Vitals:   09/30/14 0646  BP: 164/74  Pulse: 83  Temp: 97.9 F (36.6 C)  Resp: 18    HEENT-  Normocephalic, no lesions, without obvious abnormality.  Normal external eye and conjunctiva.  Normal TM's bilaterally.  Normal auditory canals and external ears. Normal external nose, mucus membranes and septum.  Normal pharynx. Cardiovascular- S1, S2 normal, pulses palpable throughout   Lungs- chest clear, no wheezing, rales, normal symmetric air entry Abdomen- normal findings: bowel sounds normal Extremities- no edema Lymph-no adenopathy palpable Musculoskeletal-no joint tenderness, deformity or swelling Skin-warm and dry, no hyperpigmentation, vitiligo, or suspicious lesions  Gen: In bed, NAD MS: awake, alert, interactive and appropriate BD:HDIX, VFF, PERRLA, TML face symmetric Motor: UE  5/5 bilaterally, hip flexion 4/5 knee extension 5/5, knee flexion 3/5 on left and 4/5 on right and DF/PF 0/5 bilaterally Sensory: decreased below the midfoot.  DTR: absent at ankles, She continues to have crossed adductors at the knees.     Pertinent Labs: ESR 105 CRP 32 CK 1997 Ceruloplasm 50.8 B12 (borderline low at 258) serum copper 181 SPEP/IFX -- SPE Interp.  Comment   Comments: (NOTE)  The SPE pattern demonstrates a single peak (M-spike) in the beta  region which may represent monoclonal protein. This peak may also  be caused by the presence of fibrinogen or circulating immune  complexes.         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  Her ANCA was positive which raises my concern considerably for a vasculitic neuropathy. At this time, she has been started on high dose  steroids. Given the elevated CPK, it may make sense to do a nerve/muscle biopsy.    Recommendations: 1) I suggest nerve or nerve/muscle biopsy to look for confirmation of vasculitis. (possibly sural and gastroc) 2) solumedrol $RemoveBefor'500mg'[id number]$  BID for a total of 6 doses then with daily dose following, would favor $RemoveBef'1mg'ymhZfPCJOJ$ /kg prednisone  pending pathology.  3) will continue to follow.    Roland Rack, MD [facility] [phone]  If 7pm- 7am, please page neurology on call as listed in [place].  09/30/2014, 10:17 AM
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
## 2014-10-01 LAB — CBC
HEMATOCRIT: 25.1 % — AB (ref 36.0–46.0)
Hemoglobin: 7.8 g/dL — ABNORMAL LOW (ref 12.0–15.0)
MCH: 25.8 pg — ABNORMAL LOW (ref 26.0–34.0)
MCHC: 31.1 g/dL (ref 30.0–36.0)
MCV: 83.1 fL (ref 78.0–100.0)
Platelets: 396 10*3/uL (ref 150–400)
RBC: 3.02 MIL/uL — ABNORMAL LOW (ref 3.87–5.11)
RDW: 15.3 % (ref 11.5–15.5)
WBC: 7.5 10*3/uL (ref 4.0–10.5)

## 2014-10-01 LAB — COMPREHENSIVE METABOLIC PANEL
ALT: 36 U/L (ref 14–54)
AST: 36 U/L (ref 15–41)
Albumin: 1.8 g/dL — ABNORMAL LOW (ref 3.5–5.0)
Alkaline Phosphatase: 153 U/L — ABNORMAL HIGH (ref 38–126)
Anion gap: 9 (ref 5–15)
BILIRUBIN TOTAL: 0.5 mg/dL (ref 0.3–1.2)
BUN: 27 mg/dL — ABNORMAL HIGH (ref 6–20)
CHLORIDE: 106 mmol/L (ref 101–111)
CO2: 23 mmol/L (ref 22–32)
CREATININE: 0.82 mg/dL (ref 0.44–1.00)
Calcium: 8 mg/dL — ABNORMAL LOW (ref 8.9–10.3)
GFR calc Af Amer: >60 mL/min (ref >60)
GFR calc non Af Amer: >60 mL/min (ref >60)
Glucose, Bld: 150 mg/dL — ABNORMAL HIGH (ref 65–99)
Potassium: 3.4 mmol/L — ABNORMAL LOW (ref 3.5–5.1)
Sodium: 138 mmol/L (ref 135–145)
Total Protein: 5.4 g/dL — ABNORMAL LOW (ref 6.5–8.1)

## 2014-10-01 LAB — IMMUNOFIXATION ELECTROPHORESIS
IGA: 209 mg/dL (ref 87–352)
IGG (IMMUNOGLOBIN G), SERUM: 1313 mg/dL (ref 700–1600)
IgM, Serum: 148 mg/dL (ref 26–217)
TOTAL PROTEIN ELP: 6.7 g/dL (ref 6.0–8.5)

## 2014-10-01 LAB — ANCA TITERS: C-ANCA: 1:640 {titer}

## 2014-10-01 LAB — CK: CK TOTAL: 23 U/L — AB (ref 38–234)

## 2014-10-01 LAB — CRYOGLOBULIN

## 2014-10-01 MED ORDER — CEFAZOLIN SODIUM-DEXTROSE 2-3 GM-% IV SOLR
2.0000 g | INTRAVENOUS | Status: AC
Start: 2014-10-02 — End: 2014-10-02
  Administered 2014-10-02: 2 g via INTRAVENOUS
  Filled 2014-10-01: qty 50

## 2014-10-01 MED ORDER — AMLODIPINE BESYLATE 10 MG PO TABS
10.0000 mg | ORAL_TABLET | Freq: Every day | ORAL | Status: DC
  Administered 2014-10-01 – 2014-10-03 (×3): 10 mg via ORAL
  Filled 2014-10-01 (×3): qty 1

## 2014-10-01 NOTE — Care Management Note (Signed)
Case Management Note  Patient Details  Name: Kristie Porter MRN: [id number] Date of Birth: 1946/04/07  Subjective/Objective:                    Action/Plan: UR updated   Expected Discharge Date:          10-03-14        Expected Discharge Plan:  [place]  In-House Referral:  Clinical Social Work  Discharge planning Services     Post Acute Care Choice:    Choice offered to:     DME Arranged:    DME Agency:     HH Arranged:    [place] Agency:     Status of Service:     Medicare Important Message Given:    Date Medicare IM Given:  09/27/14 Medicare IM give by:  Magdalen Spatz RN BSN  Date Additional Medicare IM Given:  10/01/14 Additional Medicare Important Message give by:  Magdalen Spatz RN BSN   If discussed at [place] of Stay Meetings, dates discussed:    Additional Comments:  Marilu Favre, RN 10/01/2014, 1:18 PM
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
## 2014-10-01 NOTE — Progress Notes (Signed)
Physical Therapy Treatment Patient Details Name: Kristie Porter MRN: [id number] DOB: 02-12-1946 Today's Date: 10/01/2014    History of Present Illness 69 y.o. female admitted with bilateral decreased sensation and weakness of LEs--have not found out yet what the cause is. Also had Bil UE pain and strength deficits (pain has resolved 10/01/14) can see some muscle atrophy in thenar eminance and web space    PT Comments    Pt emotional this date and required maximal encouragement to ambulate however did tolerate ambulation x 3 (10 feet each time). Used toe lifter to assist with DF. Pt to benefit from bilat AFOs to assist with bilat drop foot. Order placed and [employer] called. Pt con't to have bilat LE weakness and numbness. Con't to benefit from SNF upon d/c to achieve safe mod I level of function for safe transition home.  Follow Up Recommendations  SNF;Supervision for mobility/OOB     Equipment Recommendations  Other (comment) (bilat AFOs)    Recommendations for Other Services       Precautions / Restrictions Precautions Precautions: Fall Restrictions Weight Bearing Restrictions: No    Mobility  Bed Mobility               General bed mobility comments: pt up in chair upon PT arrival  Transfers Overall transfer level: Needs assistance Equipment used: Rolling walker (2 wheeled) Transfers: Sit to/from Stand Sit to Stand: Min assist         General transfer comment: v/c's for hand placement  Ambulation/Gait Ambulation/Gait assistance: Min assist;+2 safety/equipment Ambulation Distance (Feet): 10 Feet (x3) Assistive device: Rolling walker (2 wheeled) Gait Pattern/deviations: Step-through pattern;Decreased stride length;Decreased dorsiflexion - right;Decreased dorsiflexion - left Gait velocity: slow   General Gait Details: max encouragement given. pt did best when instructed to focus on object ahead of her and not stop until she got there, ie door across hallway. Pt  with impulsively sit otherwise stating "my legs just tell me to sit" when asked if she can feel the floor she said she can feel at her heels but not her foot. used a toe lifter on the L LE and an ace wrap on R LE to imitate AFOs and pt did well with both   Stairs            Wheelchair Mobility    Modified Rankin (Stroke Patients Only)       Balance Overall balance assessment: Needs assistance         Standing balance support: Bilateral upper extremity supported Standing balance-Leahy Scale: Poor                      Cognition Arousal/Alertness: Awake/alert Behavior During Therapy:  (pt became emotional and tearful during ambulation) Overall Cognitive Status: Within Functional Limits for tasks assessed                      Exercises      General Comments General comments (skin integrity, edema, etc.): pt became very emotional this date. also recommended bilat AFOs, order okayed by Dr. Allyson Sabal. [employer] called      Pertinent Vitals/Pain Pain Assessment: 0-10 Pain Score:  (reports, "it's wierd" more pins and needs) Pain Location: bilat feet-describes pain as pin and needs and then giving out Pain Intervention(s): Monitored during session    Home Living                      Prior Function  PT Goals (current goals can now be found in the care plan section) Progress towards PT goals: Progressing toward goals    Frequency  Min 3X/week    PT Plan Current plan remains appropriate    Co-evaluation             End of Session Equipment Utilized During Treatment: Gait belt Activity Tolerance: Patient limited by fatigue Patient left: in chair;with call bell/phone within reach     Time: 1450-1518 PT Time Calculation (min) (ACUTE ONLY): 28 min  Charges:  $Gait Training: 8-22 mins $Therapeutic Activity: 8-22 mins                    G Codes:      Kingsley Callander 10/01/2014, 4:44 PM   Kittie Plater, PT,  DPT Pager #: [phone] Office #: [phone]

## 2014-10-01 NOTE — Progress Notes (Signed)
Orthopedic Tech Progress Note Patient Details:  Thressa Shiffer May 07, 1946 [id number] [employer] contacted for brace order Patient ID: Scarlette Ar, female   DOB: 1946-02-15, 69 y.o.   MRN: [id number]   Fenton Foy 10/01/2014, 4:13 PM

## 2014-10-01 NOTE — Progress Notes (Signed)
Occupational Therapy Treatment Patient Details Name: Kristie Porter MRN: [id number] DOB: 03/03/1946 Today's Date: 10/01/2014    History of present illness 69 y.o. female admitted with bilateral decreased sensation and weakness of LEs--have not found out yet what the cause is. Also had Bil UE pain and strength deficits (pain has resolved 10/01/14) can see some muscle atrophy in thenar eminance and web space   OT comments  This 69 yo female admitted with above presents to acute with continued decreased AROM of Bil ankles distally. Arms are doing better. She is making progress and will continue to benefit from acute OT with follow up OT ST at SNF to get to a Mod I to Independent level. I have also asked PT to see if pt may benefit from Bil AFOs.  Follow Up Recommendations  SNF;Supervision/Assistance - 24 hour    Equipment Recommendations  3 in 1 bedside comode       Precautions / Restrictions Precautions Precautions: Fall Restrictions Weight Bearing Restrictions: No       Mobility Bed Mobility               General bed mobility comments: Pt up on BSC upon my entrance into room  Transfers Overall transfer level: Needs assistance Equipment used: 1 person hand held assist Transfers: Sit to/from Stand;Stand Pivot Transfers Sit to Stand: Min assist Stand pivot transfers: Min assist (with me standing in front of her holding onto belt.)       General transfer comment: Pt without any AROM of ankles/feet (foot drop) so have to be careful with stepping--her PROM into dorsiflexion is slightly past midline (showed her how to do ankle stretching with use of rolled up sheet)    Balance Overall balance assessment: Needs assistance Sitting-balance support: No upper extremity supported;Feet supported Sitting balance-Leahy Scale: Good     Standing balance support: Bilateral upper extremity supported Standing balance-Leahy Scale: Poor                     ADL Overall ADL's :  Needs assistance/impaired                     Lower Body Dressing: Minimal assistance (to pull up pants with min A to maintain standing)   Toilet Transfer: Minimal assistance;Stand-pivot;BSC (with therapist standing in front of her holding onto belt("dance"))   Toileting- Clothing Manipulation and Hygiene: Minimal assistance (for clothing sit>stand; Mod I for hygiene leaning)                Vision                 Additional Comments: No change from baseline          Cognition   Behavior During Therapy: WFL for tasks assessed/performed Overall Cognitive Status: Within Functional Limits for tasks assessed                         Exercises Other Exercises Other Exercises: Issued pt red putty and instructed her in exercies/activites to do with it (squeeze, roll into ball, press between hands or flat on table, roll into a log, pinch and pull with thumb and first finger)           Pertinent Vitals/ Pain       Pain Assessment: No/denies pain         Frequency Min 2X/week     Progress Toward Goals  OT Goals(current goals can now be  found in the care plan section)  Progress towards OT goals: Progressing toward goals     Plan Discharge plan remains appropriate       End of Session Equipment Utilized During Treatment: Gait belt   Activity Tolerance Patient tolerated treatment well   Patient Left in chair;with call bell/phone within reach   Nurse Communication  (nurse in room to help me with getting her off of BSC and tolileting clothing)        Time: 8403-7543 OT Time Calculation (min): 33 min  Charges: OT General Charges $OT Visit: 1 Procedure OT Treatments $Self Care/Home Management : 8-22 mins $Therapeutic Exercise: 8-22 mins  Almon Register [phone] 10/01/2014, 12:22 PM

## 2014-10-02 ENCOUNTER — Inpatient Hospital Stay (HOSPITAL_COMMUNITY): Payer: Medicare Other | Admitting: Anesthesiology

## 2014-10-02 ENCOUNTER — Encounter (HOSPITAL_COMMUNITY): Payer: Self-pay | Admitting: Certified Registered Nurse Anesthetist

## 2014-10-02 ENCOUNTER — Encounter (HOSPITAL_COMMUNITY)
Admission: AD | Disposition: A | Discharge status: Skilled Nursing Facility | Payer: Self-pay | Source: Ambulatory Visit | Attending: Internal Medicine

## 2014-10-02 HISTORY — PX: SURAL NERVE BX: SHX2476

## 2014-10-02 LAB — LEAD, URINE, 24 HOUR: Lead, 24H Ur: <10 mcg/L (ref <80)

## 2014-10-02 LAB — HEAVY METALS PROFILE, URINE
Arsenic (Total),U: NOT DETECTED ug/L (ref 0–50)
Arsenic(Inorganic),U: NOT DETECTED ug/L (ref 0–19)
Creatinine(Crt),U: 0.26 g/L — ABNORMAL LOW (ref 0.30–3.00)
LEAD RANDOM URINE: NOT DETECTED ug/L (ref 0–49)
MERCURY UR: NOT DETECTED ug/L (ref 0–19)

## 2014-10-02 SURGERY — SURAL NERVE BIOPSY
Anesthesia: Monitor Anesthesia Care | Laterality: Right

## 2014-10-02 MED ORDER — PROPOFOL INFUSION 10 MG/ML OPTIME
INTRAVENOUS | Status: DC | PRN
  Administered 2014-10-02: 25 ug/kg/min via INTRAVENOUS

## 2014-10-02 MED ORDER — ONDANSETRON HCL 4 MG/2ML IJ SOLN
INTRAMUSCULAR | Status: DC | PRN
  Administered 2014-10-02: 4 mg via INTRAVENOUS

## 2014-10-02 MED ORDER — 0.9 % SODIUM CHLORIDE (POUR BTL) OPTIME
TOPICAL | Status: DC | PRN
  Administered 2014-10-02: 1000 mL

## 2014-10-02 MED ORDER — PROPOFOL 10 MG/ML IV BOLUS
INTRAVENOUS | Status: AC
  Filled 2014-10-02: qty 20

## 2014-10-02 MED ORDER — MIDAZOLAM HCL 5 MG/5ML IJ SOLN
INTRAMUSCULAR | Status: DC | PRN
  Administered 2014-10-02: 2 mg via INTRAVENOUS

## 2014-10-02 MED ORDER — LACTATED RINGERS IV SOLN
INTRAVENOUS | Status: DC | PRN
  Administered 2014-10-02: 10:00:00 via INTRAVENOUS

## 2014-10-02 MED ORDER — DEXAMETHASONE SODIUM PHOSPHATE 10 MG/ML IJ SOLN
INTRAMUSCULAR | Status: DC | PRN
  Administered 2014-10-02: 10 mg via INTRAVENOUS

## 2014-10-02 MED ORDER — MIDAZOLAM HCL 2 MG/2ML IJ SOLN
INTRAMUSCULAR | Status: AC
  Filled 2014-10-02: qty 2

## 2014-10-02 MED ORDER — ONDANSETRON HCL 4 MG/2ML IJ SOLN
INTRAMUSCULAR | Status: AC
  Filled 2014-10-02: qty 2

## 2014-10-02 MED ORDER — DEXAMETHASONE SODIUM PHOSPHATE 10 MG/ML IJ SOLN
INTRAMUSCULAR | Status: AC
  Filled 2014-10-02: qty 1

## 2014-10-02 MED ORDER — PROMETHAZINE HCL 25 MG/ML IJ SOLN: 6.2500 mg | INTRAMUSCULAR | Status: DC | PRN

## 2014-10-02 MED ORDER — CEFAZOLIN SODIUM-DEXTROSE 2-3 GM-% IV SOLR
INTRAVENOUS | Status: AC
  Filled 2014-10-02: qty 50

## 2014-10-02 MED ORDER — BUPIVACAINE-EPINEPHRINE 0.5% -1:200000 IJ SOLN
INTRAMUSCULAR | Status: DC | PRN
  Administered 2014-10-02: 28 mL

## 2014-10-02 MED ORDER — FENTANYL CITRATE (PF) 100 MCG/2ML IJ SOLN
INTRAMUSCULAR | Status: AC
  Filled 2014-10-02: qty 2

## 2014-10-02 MED ORDER — FENTANYL CITRATE (PF) 250 MCG/5ML IJ SOLN
INTRAMUSCULAR | Status: AC
  Filled 2014-10-02: qty 5

## 2014-10-02 MED ORDER — MEPERIDINE HCL 25 MG/ML IJ SOLN: 6.2500 mg | INTRAMUSCULAR | Status: DC | PRN

## 2014-10-02 MED ORDER — MIDAZOLAM HCL 2 MG/2ML IJ SOLN: 0.5000 mg | Freq: Once | INTRAMUSCULAR | Status: DC | PRN

## 2014-10-02 MED ORDER — FENTANYL CITRATE (PF) 100 MCG/2ML IJ SOLN
25.0000 ug | INTRAMUSCULAR | Status: DC | PRN
  Administered 2014-10-02: 50 ug via INTRAVENOUS

## 2014-10-02 MED ORDER — FENTANYL CITRATE (PF) 100 MCG/2ML IJ SOLN
INTRAMUSCULAR | Status: DC | PRN
  Administered 2014-10-02 (×2): 25 ug via INTRAVENOUS
  Administered 2014-10-02: 50 ug via INTRAVENOUS
  Administered 2014-10-02 (×2): 25 ug via INTRAVENOUS

## 2014-10-02 MED ORDER — POTASSIUM CHLORIDE CRYS ER 20 MEQ PO TBCR
40.0000 meq | EXTENDED_RELEASE_TABLET | Freq: Once | ORAL | Status: AC
  Administered 2014-10-02: 40 meq via ORAL
  Filled 2014-10-02: qty 2

## 2014-10-02 MED ORDER — STERILE WATER FOR INJECTION IJ SOLN
INTRAMUSCULAR | Status: AC
  Filled 2014-10-02: qty 10

## 2014-10-02 SURGICAL SUPPLY — 50 items
APL SKNCLS STERI-STRIP NONHPOA (GAUZE/BANDAGES/DRESSINGS)
BENZOIN TINCTURE PRP APPL 2/3 (GAUZE/BANDAGES/DRESSINGS) IMPLANT
BLADE CLIPPER SURG (BLADE) IMPLANT
BLADE SURG 15 STRL LF DISP TIS (BLADE) ×1 IMPLANT
BLADE SURG 15 STRL SS (BLADE) ×3
BNDG GAUZE ELAST 4 BULKY (GAUZE/BANDAGES/DRESSINGS) IMPLANT
CLOSURE WOUND 1/2 X4 (GAUZE/BANDAGES/DRESSINGS)
CONT SPEC 4OZ CLIKSEAL STRL BL (MISCELLANEOUS) ×6 IMPLANT
CORDS BIPOLAR (ELECTRODE) ×3 IMPLANT
DEPRESSOR TONGUE BLADE STERILE (MISCELLANEOUS) ×3 IMPLANT
DRAPE EXTREMITY T 121X128X90 (DRAPE) IMPLANT
DRAPE INCISE IOBAN 66X45 STRL (DRAPES) ×3 IMPLANT
DRAPE PROXIMA HALF (DRAPES) ×3 IMPLANT
DRSG TELFA 3X8 NADH (GAUZE/BANDAGES/DRESSINGS) ×3 IMPLANT
DURAPREP 26ML APPLICATOR (WOUND CARE) ×3 IMPLANT
DURAPREP 6ML APPLICATOR 50/CS (WOUND CARE) ×3 IMPLANT
GAUZE SPONGE 4X4 12PLY STRL (GAUZE/BANDAGES/DRESSINGS) IMPLANT
GAUZE SPONGE 4X4 16PLY XRAY LF (GAUZE/BANDAGES/DRESSINGS) ×3 IMPLANT
GLOVE BIO SURGEON STRL SZ7 (GLOVE) ×6 IMPLANT
GLOVE BIOGEL M 8.0 STRL (GLOVE) ×3 IMPLANT
GLOVE BIOGEL PI IND STRL 7.5 (GLOVE) ×2 IMPLANT
GLOVE BIOGEL PI INDICATOR 7.5 (GLOVE) ×4
GLOVE ECLIPSE 7.0 STRL STRAW (GLOVE) ×3 IMPLANT
GLOVE EXAM NITRILE LRG STRL (GLOVE) IMPLANT
GLOVE EXAM NITRILE MD LF STRL (GLOVE) ×3 IMPLANT
GLOVE EXAM NITRILE XL STR (GLOVE) IMPLANT
GLOVE EXAM NITRILE XS STR PU (GLOVE) IMPLANT
GOWN STRL REUS W/ TWL LRG LVL3 (GOWN DISPOSABLE) ×3 IMPLANT
GOWN STRL REUS W/ TWL XL LVL3 (GOWN DISPOSABLE) IMPLANT
GOWN STRL REUS W/TWL 2XL LVL3 (GOWN DISPOSABLE) IMPLANT
GOWN STRL REUS W/TWL LRG LVL3 (GOWN DISPOSABLE) ×6
GOWN STRL REUS W/TWL XL LVL3 (GOWN DISPOSABLE)
KIT BASIN OR (CUSTOM PROCEDURE TRAY) ×3 IMPLANT
KIT ROOM TURNOVER OR (KITS) ×3 IMPLANT
LIQUID BAND (GAUZE/BANDAGES/DRESSINGS) ×6 IMPLANT
NEEDLE HYPO 25X1 1.5 SAFETY (NEEDLE) ×6 IMPLANT
NS IRRIG 1000ML POUR BTL (IV SOLUTION) ×3 IMPLANT
PACK SURGICAL SETUP 50X90 (CUSTOM PROCEDURE TRAY) ×3 IMPLANT
PAD ARMBOARD 7.5X6 YLW CONV (MISCELLANEOUS) ×3 IMPLANT
SPECIMEN JAR SMALL (MISCELLANEOUS) IMPLANT
STRIP CLOSURE SKIN 1/2X4 (GAUZE/BANDAGES/DRESSINGS) IMPLANT
SUT SILK 0 TIES 10X30 (SUTURE) ×3 IMPLANT
SUT VIC AB 2-0 CP2 18 (SUTURE) ×3 IMPLANT
SUT VIC AB 3-0 SH 8-18 (SUTURE) ×6 IMPLANT
SYR 3ML LL SCALE MARK (SYRINGE) ×3 IMPLANT
SYR BULB 3OZ (MISCELLANEOUS) ×3 IMPLANT
SYR CONTROL 10ML LL (SYRINGE) ×3 IMPLANT
TOWEL OR 17X24 6PK STRL BLUE (TOWEL DISPOSABLE) ×3 IMPLANT
TOWEL OR 17X26 10 PK STRL BLUE (TOWEL DISPOSABLE) ×6 IMPLANT
WATER STERILE IRR 1000ML POUR (IV SOLUTION) ×3 IMPLANT

## 2014-10-02 NOTE — Clinical Social Work Note (Signed)
Clinical Social Work Assessment  Patient Details  Name: Kristie Porter MRN: [id number] Date of Birth: [birth year]  Date of referral:  10/02/14               Reason for consult:  Facility Placement, Discharge Planning                Permission sought to share information with:  Other (Patient alert and oriented.) Permission granted to share information::  No  Name::        Agency::     Relationship::     Contact Information:     Housing/Transportation Living arrangements for the past 2 months:  Single Family Home Source of Information:  Patient Patient Interpreter Needed:  None Criminal Activity/Legal Involvement Pertinent to Current Situation/Hospitalization:  No - Comment as needed Significant Relationships:  Spouse Lives with:  Spouse Do you feel safe going back to the place where you live?  No (High fall risk.) Need for family participation in patient care:  No (Coment) (Patient alert and oriented.)  Care giving concerns:  Patient nor patient's husband expressed any concerns at this time.   Social Worker assessment / plan:  CSW received referral for SNF placement at time of discharge. CSW met with patient and patient's husband at bedside to discuss SNF placement. Patient and patient's husband agreeable, and prefer placement in close proximity to patient's home ([place], [place]). CSW to initiate SNF search in [place]. CSW to continue to follow and assist with discharge planning needs.  Employment status:  Retired Forensic scientist:  Medicare PT Recommendations:  [place] / Referral to community resources:  [place]  Patient/Family's Response to care:  Patient and patient's husband understanding and agreeable to CSW plan of care.  Patient/Family's Understanding of and Emotional Response to Diagnosis, Current Treatment, and Prognosis:  Patient and patient's husband understanding and agreeable to CSW plan of care.  Emotional  Assessment Appearance:  Appears younger than stated age Attitude/Demeanor/Rapport:  Other (Pleasant.) Affect (typically observed):  Pleasant, Appropriate, Accepting, Calm, Quiet Orientation:  Oriented to Self, Oriented to Place, Oriented to  Time, Oriented to Situation Alcohol / Substance use:  Not Applicable Psych involvement (Current and /or in the community):  No (Comment) (Not appropriate on this admission.)  Discharge Needs  Concerns to be addressed:  No discharge needs identified Readmission within the last 30 days:  No Current discharge risk:  None Barriers to Discharge:  No Barriers Identified   Caroline Sauger, LCSW 10/02/2014, 3:06 PM [phone]

## 2014-10-02 NOTE — Progress Notes (Signed)
PT Cancellation Note  Patient Details Name: Kristie Porter MRN: [id number] DOB: 01-27-1946   Cancelled Treatment:    Reason Eval/Treat Not Completed: Patient s/p R sural nerve and gastrocnemius biospy   Duncan Dull 10/02/2014, 4:40 PM Alben Deeds, [place] DPT  [phone]

## 2014-10-02 NOTE — Anesthesia Postprocedure Evaluation (Signed)
  Anesthesia Post-op Note  Patient: Kristie Porter  Procedure(s) Performed: Procedure(s): Right sural nerve and gastrocnemius muscle biopsy (Right)  Patient Location: PACU  Anesthesia Type:MAC  Level of Consciousness: awake, alert , oriented and patient cooperative  Airway and Oxygen Therapy: Patient Spontanous Breathing  Post-op Pain: none  Post-op Assessment: Post-op Vital signs reviewed, Patient's Cardiovascular Status Stable, Respiratory Function Stable, Patent Airway, No signs of Nausea or vomiting and Pain level controlled  Post-op Vital Signs: Reviewed and stable  Last Vitals:  Filed Vitals:   10/02/14 1230  BP: 170/74  Pulse:   Temp: 36.5 C  Resp:     Complications: No apparent anesthesia complications

## 2014-10-02 NOTE — Anesthesia Preprocedure Evaluation (Addendum)
Anesthesia Evaluation  Patient identified by MRN, date of birth, ID band Patient awake    Reviewed: Allergy & Precautions, NPO status , Patient's Chart, lab work & pertinent test results  History of Anesthesia Complications Negative for: history of anesthetic complications  Airway Mallampati: I  TM Distance: >3 FB Neck ROM: Full    Dental  (+) Dental Advisory Given, Caps   Pulmonary Recent URI  (residual nose and ear congestion),  breath sounds clear to auscultation        Cardiovascular hypertension, Pt. on medications - anginaRhythm:Regular Rate:Normal     Neuro/Psych Peripheral neuropathy of feet: does not ambulate High dose steroid ended yesterday    GI/Hepatic negative GI ROS, Neg liver ROS,   Endo/Other    Renal/GU negative Renal ROS     Musculoskeletal  (+) Arthritis -, Weakness of feet bilaterally   Abdominal   Peds  Hematology  (+) Blood dyscrasia (Hb 7.8), ,   Anesthesia Other Findings   Reproductive/Obstetrics                           Anesthesia Physical Anesthesia Plan  ASA: III  Anesthesia Plan: MAC   Post-op Pain Management:    Induction: Intravenous  Airway Management Planned: Natural Airway and Simple Face Mask  Additional Equipment:   Intra-op Plan:   Post-operative Plan:   Informed Consent: I have reviewed the patients History and Physical, chart, labs and discussed the procedure including the risks, benefits and alternatives for the proposed anesthesia with the patient or authorized representative who has indicated his/her understanding and acceptance.   Dental advisory given  Plan Discussed with: CRNA and Surgeon  Anesthesia Plan Comments: (Plan routine monitors, MAC)        Anesthesia Quick Evaluation

## 2014-10-02 NOTE — Care Management Note (Signed)
Case Management Note  Patient Details  Name: Kristie Porter MRN: [id number] Date of Birth: Jun 20, 1945  Subjective/Objective:                    Action/Plan:   Expected Discharge Date:       10-05-14            Expected Discharge Plan:  [name]  In-House Referral:  Clinical Social Work  Discharge planning Services     Post Acute Care Choice:    Choice offered to:     DME Arranged:    DME Agency:     HH Arranged:    [place] Agency:     Status of Service:  In process, will continue to follow  Medicare Important Message Given:    Date Medicare IM Given:  09/27/14 Medicare IM give by:  Magdalen Spatz RN BSN  Date Additional Medicare IM Given:  10/01/14 Additional Medicare Important Message give by:  Magdalen Spatz RN BSN   If discussed at [place] of Stay Meetings, dates discussed:  10-02-14   Additional Comments:  Marilu Favre, RN 10/02/2014, 8:10 AM

## 2014-10-02 NOTE — Anesthesia Procedure Notes (Signed)
Procedure Name: MAC Date/Time: 10/02/2014 10:57 AM Performed by: Garrison Columbus T Pre-anesthesia Checklist: Patient identified, Emergency Drugs available, Suction available and Patient being monitored Patient Re-evaluated:Patient Re-evaluated prior to inductionOxygen Delivery Method: Simple face mask Preoxygenation: Pre-oxygenation with 100% oxygen Intubation Type: IV induction Placement Confirmation: positive ETCO2 and breath sounds checked- equal and bilateral Dental Injury: Teeth and Oropharynx as per pre-operative assessment
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
## 2014-10-02 NOTE — Progress Notes (Signed)
Patient ID: Kristie Porter, female   DOB: Jun 22, 1945, 69 y.o.   MRN: [id number] Biopsy of the gastronemius muscle and sural nerve were done under iv sedation. Local was used -after- the specimens were removed

## 2014-10-02 NOTE — Transfer of Care (Signed)
Immediate Anesthesia Transfer of Care Note  Patient: Kristie Porter  Procedure(s) Performed: Procedure(s): Right sural nerve and gastrocnemius muscle biopsy (Right)  Patient Location: PACU  Anesthesia Type:MAC  Level of Consciousness: awake, alert  and oriented  Airway & Oxygen Therapy: Patient Spontanous Breathing  Post-op Assessment: Report given to RN, Post -op Vital signs reviewed and stable and Patient moving all extremities X 4  Post vital signs: Reviewed and stable  Last Vitals:  Filed Vitals:   10/02/14 1205  BP: 161/71  Pulse: 79  Temp:   Resp: 19    Complications: No apparent anesthesia complications

## 2014-10-02 NOTE — Progress Notes (Signed)
BP 175/80, HR 86. NAD. Tylene Fantasia notified.

## 2014-10-02 NOTE — Progress Notes (Signed)
Patient ID: Kristie Porter, female   DOB: 20-Jun-1945, 69 y.o.   MRN: [id number] For right sural and muscle biopsy today  . Aware of risks and benefits

## 2014-10-03 ENCOUNTER — Encounter (HOSPITAL_COMMUNITY): Payer: Self-pay | Admitting: Neurosurgery

## 2014-10-03 DIAGNOSIS — R29898 Other symptoms and signs involving the musculoskeletal system: Secondary | ICD-10-CM

## 2014-10-03 DIAGNOSIS — R208 Other disturbances of skin sensation: Secondary | ICD-10-CM

## 2014-10-03 LAB — ARSENIC, URINE, 24 HOUR
Arsenic /G Creat, 24H Ur: <25 ug/g creat
CREATININE, URINE-MG/DL-ARSUR: 23 mg/dL — AB (ref >50)

## 2014-10-03 MED ORDER — PREDNISONE 50 MG PO TABS
50.0000 mg | ORAL_TABLET | Freq: Every day | ORAL | Status: DC
  Administered 2014-10-03: 50 mg via ORAL
  Filled 2014-10-03: qty 1

## 2014-10-03 MED ORDER — AMLODIPINE BESYLATE 10 MG PO TABS: 10.0000 mg | ORAL_TABLET | Freq: Every day | ORAL | Status: DC

## 2014-10-03 MED ORDER — PREDNISONE 50 MG PO TABS: ORAL_TABLET | ORAL | Status: DC

## 2014-10-03 MED ORDER — METHOCARBAMOL 500 MG PO TABS: 500.0000 mg | ORAL_TABLET | Freq: Three times a day (TID) | ORAL | Status: DC | PRN

## 2014-10-03 MED ORDER — CIPROFLOXACIN-DEXAMETHASONE 0.3-0.1 % OT SUSP: 2.0000 [drp] | Freq: Two times a day (BID) | OTIC | Status: DC

## 2014-10-03 MED ORDER — HYDROCODONE-ACETAMINOPHEN 5-325 MG PO TABS: 1.0000 | ORAL_TABLET | Freq: Three times a day (TID) | ORAL | Status: DC | PRN

## 2014-10-03 NOTE — Discharge Planning (Signed)
Patient to be discharged to [facility]: [facility] Patient and patient's husband updated regarding discharge.  Facility: [facility]: [facility] Report: [phone] Transportation: EMS ([address])  Lubertha Sayres, [place] [phone]) and Surgical [phone])

## 2014-10-03 NOTE — Progress Notes (Signed)
Report called to [facility] and given to nurse Deneise Lever. All documents sent with patient.  Patient ready for discharge and transported via [place]. Patient in route to facility.

## 2014-10-03 NOTE — Progress Notes (Signed)
Occupational Therapy Treatment Patient Details Name: Kristie Porter MRN: [id number] DOB: [birth year] Today's Date: 10/03/2014    History of present illness 69 y.o. female admitted with bilateral decreased sensation and weakness of LEs--have not found out yet what the cause is. Also had Bil UE pain and strength deficits (pain has resolved 10/01/14) can see some muscle atrophy in thenar eminance and web space   OT comments  Patient progressing towards OT goals, continue plan of care for now. Patient continues to be self-limiting, but willing to work with this therapist on a BUE HEP and strengthening program for R hand. Administered green theraband and yellow theraputty, and taught exercises. Encouraged patient to engage in exercises at least 3X per day; 3 sets of 10 exercises.    Follow Up Recommendations  SNF;Supervision/Assistance - 24 hour    Equipment Recommendations  3 in 1 bedside comode;Other (comment) (any other TBD next venue of care)    Recommendations for Other Services Rehab consult    Precautions / Restrictions Precautions Precautions: Fall Precaution Comments: recent biopsy > RLE Restrictions Weight Bearing Restrictions: No       Mobility Bed Mobility General bed mobility comments: Did not occur this session  Transfers General transfer comment: Did not occur this session        ADL General ADL Comments: No ADL completed, education on BUE HEP and right hand strengthening program using theraputty. Therapist administerred theraputty and green theraband to help increase strength and function > BUEs, especially RUE.      Cognition   Behavior During Therapy: WFL for tasks assessed/performed Overall Cognitive Status: Within Functional Limits for tasks assessed                 Pertinent Vitals/ Pain       Pain Assessment: No/denies pain Faces Pain Scale: No hurt         Frequency Min 2X/week     Progress Toward Goals  OT Goals(current goals can now be  found in the care plan section)  Progress towards OT goals: Progressing toward goals     Plan Discharge plan remains appropriate    Activity Tolerance Patient tolerated treatment well  Patient Left in bed;with call bell/phone within reach;with family/visitor present    Time: 2725-3664 OT Time Calculation (min): 18 min  Charges: OT General Charges $OT Visit: 1 Procedure OT Treatments $Therapeutic Exercise: 8-22 mins  Brigitte Soderberg , MS, OTR/L, CLT Pager: [phone]  10/03/2014, 2:36 PM
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
## 2014-10-03 NOTE — Clinical Social Work Placement (Signed)
   CLINICAL SOCIAL WORK PLACEMENT  NOTE  Date:  10/03/2014  Patient Details  Name: Kristie Porter MRN: [id number] Date of Birth: 11-15-1945  Clinical Social Work is seeking post-discharge placement for this patient at the [place] level of care (*CSW will initial, date and re-position this form in  chart as items are completed):  Yes   Patient/family provided with [employer] Work Department's list of facilities offering this level of care within the geographic area requested by the patient (or if unable, by the patient's family).  Yes   Patient/family informed of their freedom to choose among providers that offer the needed level of care, that participate in Medicare, Medicaid or managed care program needed by the patient, have an available bed and are willing to accept the patient.  Yes   Patient/family informed of [facility]'s ownership interest in [facility] and [facility], as well as of the fact that they are under no obligation to receive care at these facilities.  PASRR submitted to EDS on 10/02/14     PASRR number received on 10/02/14     Existing PASRR number confirmed on       FL2 transmitted to all facilities in geographic area requested by pt/family on 10/02/14     FL2 transmitted to all facilities within larger geographic area on       Patient informed that his/her managed care company has contracts with or will negotiate with certain facilities, including the following:        Yes   Patient/family informed of bed offers received.  Patient chooses bed at [facility]     Physician recommends and patient chooses bed at      Patient to be transferred to [facility] on 10/03/14.  Patient to be transferred to facility by [facility]     Patient family notified on 10/03/14 of transfer.  Name of family member notified:  Patient and patient's husband updated at bedside.     PHYSICIAN       Additional Comment:     _______________________________________________ Caroline Sauger, LCSW 10/03/2014, 11:07 AM [phone]

## 2014-10-03 NOTE — Discharge Instructions (Signed)
Follow with Dr. Jannifer Franklin in 1 day Please discuss with Dr. Jannifer Franklin how long you will need prednisone and on what dose.  Please get a complete blood count and chemistry panel checked by your Primary MD at your next visit, and again as instructed by your Primary MD. Please get your medications reviewed and adjusted by your Primary MD.  Please request your Primary MD to go over all Hospital Tests and Procedure/Radiological results at the follow up, please get all Hospital records sent to your Prim MD by signing hospital release before you go home.  If you had Pneumonia of Lung problems at the Hospital: Please get a 2 view Chest X ray done in 6-8 weeks after hospital discharge or sooner if instructed by your Primary MD.  If you have Congestive Heart Failure: Please call your Cardiologist or Primary MD anytime you have any of the following symptoms:  1) 3 pound weight gain in 24 hours or 5 pounds in 1 week  2) shortness of breath, with or without a dry hacking cough  3) swelling in the hands, feet or stomach  4) if you have to sleep on extra pillows at night in order to breathe  Follow cardiac low salt diet and 1.5 lit/day fluid restriction.  If you have diabetes Accuchecks 4 times/day, Once in AM empty stomach and then before each meal. Log in all results and show them to your primary doctor at your next visit. If any glucose reading is under 80 or above 300 call your primary MD immediately.  If you have Seizure/Convulsions/Epilepsy: Please do not drive, operate heavy machinery, participate in activities at heights or participate in high speed sports until you have seen by Primary MD or a Neurologist and advised to do so again.  If you had Gastrointestinal Bleeding: Please ask your Primary MD to check a complete blood count within one week of discharge or at your next visit. Your endoscopic/colonoscopic biopsies that are pending at the time of discharge, will also need to followed by your Primary  MD.  Get Medicines reviewed and adjusted. Please take all your medications with you for your next visit with your Primary MD  Please request your Primary MD to go over all hospital tests and procedure/radiological results at the follow up, please ask your Primary MD to get all Hospital records sent to his/her office.  If you experience worsening of your admission symptoms, develop shortness of breath, life threatening emergency, suicidal or homicidal thoughts you must seek medical attention immediately by calling 911 or calling your MD immediately  if symptoms less severe.  You must read complete instructions/literature along with all the possible adverse reactions/side effects for all the Medicines you take and that have been prescribed to you. Take any new Medicines after you have completely understood and accpet all the possible adverse reactions/side effects.   Do not drive or operate heavy machinery when taking Pain medications.   Do not take more than prescribed Pain, Sleep and Anxiety Medications  Special Instructions: If you have smoked or chewed Tobacco  in the last 2 yrs please stop smoking, stop any regular Alcohol  and or any Recreational drug use.  Wear Seat belts while driving.  Please note You were cared for by a hospitalist during your hospital stay. If you have any questions about your discharge medications or the care you received while you were in the hospital after you are discharged, you can call the unit and asked to speak with the hospitalist  on call if the hospitalist that took care of you is not available. Once you are discharged, your primary care physician will handle any further medical issues. Please note that NO REFILLS for any discharge medications will be authorized once you are discharged, as it is imperative that you return to your primary care physician (or establish a relationship with a primary care physician if you do not have one) for your aftercare needs so  that they can reassess your need for medications and monitor your lab values.  You can reach the hospitalist office at phone [phone] or fax [phone]   If you do not have a primary care physician, you can call [phone] for a physician referral.  Activity: As tolerated with Full fall precautions use walker/cane & assistance as needed  Diet: regular  Disposition SNF

## 2014-10-03 NOTE — Op Note (Signed)
**Note Kristie Porter** NAMEDEVONIA, FARRO NO.:  [id number]  MEDICAL RECORD NO.:  [id number]  LOCATION:  [id number]                        FACILITY:  [facility]  PHYSICIAN:  Leeroy Cha, M.D.   DATE OF BIRTH:  03-16-1946  DATE OF PROCEDURE:  10/02/2014 DATE OF DISCHARGE:                              OPERATIVE REPORT   PREOPERATIVE DIAGNOSIS:  Peripheral vascular neuropathy.  POSTOPERATIVE DIAGNOSIS:  Peripheral vascular neuropathy.  PROCEDURES: 1. Biopsy of the gastrocnemius muscle. 2. Biopsy of the sural nerve, right leg and foot.  SURGEON:  Leeroy Cha, M.D.  ASSISTANT:  Dr. Kathyrn Sheriff.  CLINICAL HISTORY:  We were approached by neurologist because Mrs. Thomann had been admitted to the hospital with history of weakness, pain and numbness.  The neurologist wanted [place] to proceed with biopsy of the gastrocnemius muscle as well as biopsy of the sural nerve.  The patient was given the choices about between left and right leg and she decided to go ahead with the right leg because that was the most painful.  She and her family knew the risks such as infection, being unable to remove the nerve, the muscle, infection.  DESCRIPTION OF PROCEDURE:  The patient was taken to the OR, IV sedation was given.  DuraPrep, the right foot and the right leg were cleaned and drapes were applied.  We started first in the graft to remove muscle with incision longitudinal was made through the skin and subcutaneous tissue.  We approached to find the muscle, which was swollen and sort of pale.  Incision was made distally and dissection was carried out to remove at least 3-4 cm of muscle at least 1-mm wide, which was the requirement.  Immediately, the biopsy was sent to the lab.  The area was irrigated.  Hemostasis was done with bipolar and the operative site was closed with Vicryl and Dermabond.  Then, the second incision was done in the lateral aspect of the right ankle right at the groove.  The  patient had quite a bit of edema.  Incision of approximately 5 cm was made up and down through the skin and subcutaneous tissue, quite edematous subcutaneous space.  Dissection was carried down and we were able to identify the nerve.  During this procedure, she was quite restlessness probably because of the dissection along the sural nerve.  We dissected medially and proximally, and we were able to get at least 3.5-4.5 cm specimen.  Prior to remove the specimen, I used a 25-gauge needle and introduced into the opening just to be sure that we were not dealing with any vein or artery.  Nevertheless, this patient was taken and immediately soon after, the patient rested right away.  The area was irrigated. Hemostasis was done with bipolar and the wound was closed with Vicryl and Dermabond.  The patient was going back to the Neurology Service where she came from.          ______________________________ Leeroy Cha, M.D.     EB/MEDQ  D:  10/02/2014  T:  10/03/2014  Job:  [id number]

## 2014-10-04 ENCOUNTER — Telehealth: Payer: Self-pay | Admitting: Neurology

## 2014-10-04 ENCOUNTER — Encounter: Payer: Self-pay | Admitting: Neurology

## 2014-10-04 ENCOUNTER — Ambulatory Visit (INDEPENDENT_AMBULATORY_CARE_PROVIDER_SITE_OTHER): Payer: Medicare Other | Admitting: Neurology

## 2014-10-04 VITALS — BP 126/62 | HR 106 | Ht 60.0 in

## 2014-10-04 DIAGNOSIS — R29898 Other symptoms and signs involving the musculoskeletal system: Secondary | ICD-10-CM

## 2014-10-04 DIAGNOSIS — R269 Unspecified abnormalities of gait and mobility: Secondary | ICD-10-CM

## 2014-10-04 DIAGNOSIS — M359 Systemic involvement of connective tissue, unspecified: Secondary | ICD-10-CM | POA: Diagnosis not present

## 2014-10-04 DIAGNOSIS — I776 Arteritis, unspecified: Secondary | ICD-10-CM

## 2014-10-04 DIAGNOSIS — G63 Polyneuropathy in diseases classified elsewhere: Secondary | ICD-10-CM | POA: Insufficient documentation

## 2014-10-04 HISTORY — DX: Unspecified abnormalities of gait and mobility: R26.9

## 2014-10-04 HISTORY — DX: Arteritis, unspecified: I77.6

## 2014-10-04 HISTORY — DX: Polyneuropathy in diseases classified elsewhere: G63

## 2014-10-04 LAB — CADMIUM, URINE, 24 HOUR

## 2014-10-04 MED ORDER — MYCOPHENOLATE MOFETIL 500 MG PO TABS: 500.0000 mg | ORAL_TABLET | Freq: Two times a day (BID) | ORAL | Status: DC

## 2014-10-04 NOTE — Telephone Encounter (Signed)
I called again.  Spoke with Kristie Porter.  Said they wanted to verify the patient will be starting Cellcept.  Advised according to notes, she will be started on Cellcept 500mg  twice daily.  They also wanted to verify we were able to collect labs while she was in the office, because they were told she had a "spell".  Advised I will clarify with clinic, and call back.

## 2014-10-04 NOTE — Telephone Encounter (Signed)
I called back.  No one listed below was available at the time of this call.  Will try again.

## 2014-10-04 NOTE — Telephone Encounter (Signed)
Daniel from [facility] called and requested to speak with someone regarding some of the orders that Dr. Jannifer Franklin gave at the last office visit for the pt's medication. Please call and advise. May speak with Ty Hilts, or Sam. [phone])

## 2014-10-04 NOTE — Telephone Encounter (Signed)
I verified with clinic labs were drawn here.  Called back.  Spoke with Deneise Lever.  She is aware.
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
## 2014-10-04 NOTE — Patient Instructions (Signed)

## 2014-10-05 LAB — SEDIMENTATION RATE: Sed Rate: 37 mm/hr (ref 0–40)

## 2014-10-08 LAB — CBC WITH DIFFERENTIAL/PLATELET
Basophils Absolute: 0 10*3/uL (ref 0.0–0.2)
Basos: 0 %
EOS (ABSOLUTE): 0.1 10*3/uL (ref 0.0–0.4)
Eos: 0 %
Hematocrit: 35.2 % (ref 34.0–46.6)
Hemoglobin: 10.6 g/dL — ABNORMAL LOW (ref 11.1–15.9)
IMMATURE GRANS (ABS): 0.2 10*3/uL — AB (ref 0.0–0.1)
IMMATURE GRANULOCYTES: 1 %
LYMPHS: 19 %
Lymphocytes Absolute: 3.2 10*3/uL — ABNORMAL HIGH (ref 0.7–3.1)
MCH: 25.4 pg — ABNORMAL LOW (ref 26.6–33.0)
MCHC: 30.1 g/dL — ABNORMAL LOW (ref 31.5–35.7)
MCV: 84 fL (ref 79–97)
MONOCYTES: 4 %
Monocytes Absolute: 0.6 10*3/uL (ref 0.1–0.9)
Neutrophils Absolute: 12.5 10*3/uL — ABNORMAL HIGH (ref 1.4–7.0)
Neutrophils: 76 %
PLATELETS: 465 10*3/uL — AB (ref 150–379)
RBC: 4.18 x10E6/uL (ref 3.77–5.28)
RDW: 15.9 % — AB (ref 12.3–15.4)
WBC: 16.6 10*3/uL — ABNORMAL HIGH (ref 3.4–10.8)

## 2014-10-08 LAB — B. BURGDORFI ANTIBODIES

## 2014-10-08 LAB — CRYOGLOBULIN

## 2014-10-08 LAB — HEPATITIS C ANTIBODY: Hep C Virus Ab: <0.1 s/co ratio (ref 0.0–0.9)

## 2014-10-08 LAB — HEPATITIS B SURFACE ANTIGEN: HEP B S AG: NEGATIVE

## 2014-10-09 ENCOUNTER — Telehealth: Payer: Self-pay | Admitting: Neurology

## 2014-10-09 NOTE — Telephone Encounter (Signed)
I called patient. The blood work was unremarkable exception of a minimal anemia and elevated white blood count on prednisone, and platelet level was elevated.. Otherwise, no abnormalities seen. Sedimentation rate is now normal.

## 2014-10-12 ENCOUNTER — Ambulatory Visit (INDEPENDENT_AMBULATORY_CARE_PROVIDER_SITE_OTHER): Payer: Self-pay | Admitting: Neurology

## 2014-10-12 ENCOUNTER — Ambulatory Visit (INDEPENDENT_AMBULATORY_CARE_PROVIDER_SITE_OTHER): Payer: Medicare Other | Admitting: Neurology

## 2014-10-12 ENCOUNTER — Encounter: Payer: Self-pay | Admitting: Neurology

## 2014-10-12 DIAGNOSIS — R29898 Other symptoms and signs involving the musculoskeletal system: Secondary | ICD-10-CM

## 2014-10-12 DIAGNOSIS — M359 Systemic involvement of connective tissue, unspecified: Secondary | ICD-10-CM

## 2014-10-12 DIAGNOSIS — G63 Polyneuropathy in diseases classified elsewhere: Secondary | ICD-10-CM | POA: Diagnosis not present

## 2014-10-12 DIAGNOSIS — I776 Arteritis, unspecified: Secondary | ICD-10-CM

## 2014-10-12 DIAGNOSIS — M21371 Foot drop, right foot: Secondary | ICD-10-CM | POA: Diagnosis not present

## 2014-10-12 DIAGNOSIS — M21372 Foot drop, left foot: Secondary | ICD-10-CM

## 2014-10-12 DIAGNOSIS — R269 Unspecified abnormalities of gait and mobility: Secondary | ICD-10-CM

## 2014-10-12 NOTE — Procedures (Signed)
     HISTORY:  Kristie Porter is a 69 year old patient with a rapid onset of numbness and weakness in the lower extremities. Evaluation has revealed evidence of C-ANCA positivity. The patient is felt to have a vasculitic neuropathy.  NERVE CONDUCTION STUDIES:  Nerve conduction studies were performed on the upper extremities. The distal motor latencies for the median and ulnar nerves were normal bladder. Motor amplitude for the left median nerve was normal, but the amplitudes for the right median nerve and for the arm nerves bilaterally were low. The F wave latencies and nerve conduction velocities for the median and ulnar nerves were normal bilaterally. The sensory latencies for the median and ulnar nerves were normal bilaterally.  Nerve conduction studies were performed on both lower extremities. The studies for the peroneal and posterior tibial nerves were absent bilaterally. The H reflex latencies and peroneal sensory latencies were unobtainable bilaterally.  EMG STUDIES:  EMG study was performed on the left lower extremity:  The tibialis anterior muscle reveals no voluntary motor units with no recruitment. 2+ fibrillations or positive waves were seen. The peroneus tertius muscle reveals no voluntary motor units with  norecruitment. 2+  fibrillations and  positive waves were seen. The medial gastrocnemius muscle reveals no voluntary  motor units with no  recruitment. 2+  fibrillations and  positive waves were seen. The vastus lateralis muscle reveals 2 to 3 K motor units with decreased  recruitment. No fibrillations or positive waves were seen. The iliopsoas muscle reveals 2 to 4K motor units with full recruitment. No fibrillations or positive waves were seen. The biceps femoris muscle (long head) reveals 2 to 4K motor units with full recruitment. No fibrillations or positive waves were seen. The lumbosacral paraspinal muscles were tested at 3 levels, and revealed no abnormalities of  insertional activity at all 3 levels tested. There was good relaxation.   IMPRESSION:  Nerve conduction studies done on all 4 extremities reveal evidence of a severe primarily axonal peripheral neuropathy. EMG evaluation of the left lower extremity shows distal severe denervation consistent with the diagnosis of peripheral neuropathy. No evidence of an overlying lumbosacral radiculopathy was seen on the left.  Jill Alexanders MD 10/12/2014 10:59 AM  [facility] [address] [place], [place] [address]  Phone [phone] Fax [phone]

## 2014-10-12 NOTE — Progress Notes (Signed)
The patient comes in today for EMG and nerve conduction study evaluation. The study shows a severe axonal peripheral neuropathy.  The patient was given a prescription for bilateral AFO braces, a rheumatology referral set up. The patient will be seen through the [facility] in the near future. The patient will follow-up through this office in about 3 months, she is to remain on prednisone. Other medication such as methotrexate, Imuran, or CellCept may need to be added in the future.

## 2014-10-12 NOTE — Progress Notes (Signed)
Please refer to EMG and nerve conduction study procedure note. 

## 2014-10-22 ENCOUNTER — Encounter: Payer: Medicare Other | Admitting: Neurology

## 2014-10-25 ENCOUNTER — Encounter (HOSPITAL_COMMUNITY): Payer: Self-pay

## 2015-02-22 ENCOUNTER — Other Ambulatory Visit: Payer: Self-pay | Admitting: Nephrology

## 2015-02-22 DIAGNOSIS — M3130 Wegener's granulomatosis without renal involvement: Secondary | ICD-10-CM

## 2015-02-27 ENCOUNTER — Other Ambulatory Visit: Payer: Medicare Other

## 2015-02-28 ENCOUNTER — Ambulatory Visit
Admission: RE | Admit: 2015-02-28 | Discharge: 2015-02-28 | Disposition: A | Discharge status: Home / Self Care | Payer: Medicare Other | Source: Ambulatory Visit | Attending: Nephrology | Admitting: Nephrology

## 2015-02-28 DIAGNOSIS — M3130 Wegener's granulomatosis without renal involvement: Secondary | ICD-10-CM

## 2015-05-29 ENCOUNTER — Telehealth: Payer: Self-pay

## 2015-05-29 NOTE — Telephone Encounter (Signed)
I called the patient to schedule f/u appointment. Per Dr. Jannifer Franklin, she should be seen in a couple of weeks. I left a voicemail asking her to call me back.

## 2015-05-30 NOTE — Telephone Encounter (Signed)
I called and spoke to the patient's husband. He stated that the patient is being cared for by Dr. Trudie Reed now. He does not feel like they need to see both Dr. Trudie Reed and Dr. Jannifer Franklin. I advised that he call back to schedule an appointment if they decide they need to see Dr. Jannifer Franklin again.

## 2015-09-10 ENCOUNTER — Other Ambulatory Visit: Payer: Self-pay | Admitting: Otolaryngology

## 2015-09-10 DIAGNOSIS — H9313 Tinnitus, bilateral: Secondary | ICD-10-CM

## 2015-09-10 DIAGNOSIS — H903 Sensorineural hearing loss, bilateral: Secondary | ICD-10-CM

## 2015-09-10 DIAGNOSIS — M3130 Wegener's granulomatosis without renal involvement: Secondary | ICD-10-CM

## 2015-10-02 ENCOUNTER — Other Ambulatory Visit: Payer: Medicare Other

## 2015-10-09 ENCOUNTER — Ambulatory Visit
Admission: RE | Admit: 2015-10-09 | Discharge: 2015-10-09 | Disposition: A | Discharge status: Home / Self Care | Payer: Medicare Other | Source: Ambulatory Visit | Attending: Otolaryngology | Admitting: Otolaryngology

## 2015-10-09 DIAGNOSIS — M3130 Wegener's granulomatosis without renal involvement: Secondary | ICD-10-CM

## 2015-10-09 DIAGNOSIS — H9313 Tinnitus, bilateral: Secondary | ICD-10-CM

## 2015-10-09 DIAGNOSIS — H903 Sensorineural hearing loss, bilateral: Secondary | ICD-10-CM

## 2015-10-09 MED ORDER — GADOBENATE DIMEGLUMINE 529 MG/ML IV SOLN
14.0000 mL | Freq: Once | INTRAVENOUS | Status: AC | PRN
  Administered 2015-10-09: 14 mL via INTRAVENOUS

## 2016-08-13 ENCOUNTER — Encounter: Payer: Self-pay | Admitting: Gastroenterology

## 2016-12-07 ENCOUNTER — Ambulatory Visit
Admission: RE | Admit: 2016-12-07 | Discharge: 2016-12-07 | Disposition: A | Discharge status: Home / Self Care | Payer: Medicare Other | Source: Ambulatory Visit | Attending: Internal Medicine | Admitting: Internal Medicine

## 2016-12-07 ENCOUNTER — Other Ambulatory Visit: Payer: Self-pay | Admitting: Internal Medicine

## 2016-12-07 DIAGNOSIS — R059 Cough, unspecified: Secondary | ICD-10-CM

## 2016-12-07 DIAGNOSIS — R05 Cough: Secondary | ICD-10-CM

## 2016-12-07 DIAGNOSIS — R52 Pain, unspecified: Secondary | ICD-10-CM

## 2017-04-01 ENCOUNTER — Encounter: Payer: Self-pay | Admitting: *Deleted

## 2017-04-13 ENCOUNTER — Other Ambulatory Visit: Payer: Self-pay | Admitting: Internal Medicine

## 2017-04-13 ENCOUNTER — Ambulatory Visit (INDEPENDENT_AMBULATORY_CARE_PROVIDER_SITE_OTHER): Payer: Medicare Other | Admitting: Gastroenterology

## 2017-04-13 ENCOUNTER — Ambulatory Visit
Admission: RE | Admit: 2017-04-13 | Discharge: 2017-04-13 | Disposition: A | Discharge status: Home / Self Care | Payer: Medicare Other | Source: Ambulatory Visit | Attending: Internal Medicine | Admitting: Internal Medicine

## 2017-04-13 ENCOUNTER — Encounter: Payer: Self-pay | Admitting: Gastroenterology

## 2017-04-13 VITALS — BP 120/52 | HR 62 | Ht 60.0 in | Wt 175.0 lb

## 2017-04-13 DIAGNOSIS — Z1211 Encounter for screening for malignant neoplasm of colon: Secondary | ICD-10-CM

## 2017-04-13 DIAGNOSIS — Z79899 Other long term (current) drug therapy: Secondary | ICD-10-CM

## 2017-04-13 DIAGNOSIS — R05 Cough: Secondary | ICD-10-CM

## 2017-04-13 DIAGNOSIS — R059 Cough, unspecified: Secondary | ICD-10-CM

## 2017-04-13 NOTE — Progress Notes (Signed)
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  She had a colonoscopy in 2008 which was normal without any polyps. She denies any problems with her bowels in general. No constipation, no diarrhea, no blood in her stools. She denies any abdominal pains. She denies any nausea or vomiting, she is eating well. No weight loss. She has questions about being on rituximab and if it is safe to have a colonoscopy. She reports being on rituximab for a long time, she denies any history of associated thrombocytopenia. Grandmother had colon cancer at age 66s, no other family history of colon cancer. She is several questions with a type of bowel preparation she could use for a colonoscopy.  Colonoscopy 09/30/2006 - normal exam  Past Medical History:  Diagnosis Date  . Abnormality of gait 10/04/2014  . Arthritis     "MINIMAL TO SPINE "  . Decreased ambulation status 09/2014   ' went from ambulating with a cane to being unable to ambulate  . Family history of adverse reaction to anesthesia    difficult to put my mother to sleep "  . Hypercholesteremia   . Hyperplastic colon polyp   . Numbness and tingling of both legs   . Peripheral neuropathy, collagen vascular ([place]) 10/04/2014  . RLS (restless legs syndrome)   . URI (upper respiratory infection) 05/2014  . Vasculitis ([place]) 10/04/2014     Past Surgical History:  Procedure Laterality Date  . CESAREAN SECTION    . COLONOSCOPY    . SURAL NERVE BX Right 10/02/2014   Procedure: Right sural nerve and gastrocnemius muscle biopsy;  Surgeon: Leeroy Cha, MD;  Location: [facility];  Service: Neurosurgery;  Laterality: Right;   Family History  Problem Relation Age of Onset  . Hypertension Mother   . Alzheimer's disease Mother   . Hypertension Father   . Diabetes Father   . Colon cancer Maternal Grandmother   . Stomach cancer Neg Hx    Social History    Tobacco Use  . Smoking status: Never Smoker  . Smokeless tobacco: Never Used  Substance Use Topics  . Alcohol use: Yes    Comment: rare  . Drug use: No   Current Outpatient Medications  Medication Sig Dispense Refill  . amLODipine (NORVASC) 5 MG tablet     . Ascorbic Acid (VITAMIN C) 1000 MG tablet Take 1,000 mg by mouth daily.    Marland Kitchen BIOTIN PO Take by mouth.    . Cholecalciferol (VITAMIN D3) 3000 units TABS Take by mouth.    . cyanocobalamin 1000 MCG tablet Take 1,000 mcg by mouth daily.    Marland Kitchen escitalopram (LEXAPRO) 10 MG tablet     . fexofenadine-pseudoephedrine (ALLEGRA-D 24) 180-240 MG per 24 hr tablet Take 1 tablet by mouth daily.    . Folic FYBO-F7-P10-CHENI 3 Acid (MI-OMEGA PO) Take by mouth.    . gabapentin (NEURONTIN) 300 MG capsule Take 300 mg by mouth 3 (three) times daily.    . meloxicam (MOBIC) 15 MG tablet Take 15 mg by mouth daily.    . methocarbamol (ROBAXIN) 500 MG tablet Take 1 tablet (500 mg total) by mouth every 8 (eight) hours as needed for muscle spasms. 30 tablet 0  . Multiple Vitamins-Minerals (ZINC PO) Take by mouth.    Marland Kitchen OVER THE COUNTER MEDICATION Take 1 tablet by mouth daily as needed (restless legs).    Marland Kitchen oxybutynin (DITROPAN-XL) 10  MG 24 hr tablet     . pramipexole (MIRAPEX) 0.125 MG tablet     . rosuvastatin (CRESTOR) 10 MG tablet     . trolamine salicylate (ASPERCREME) 10 % cream Apply 1 application topically as needed for muscle pain.     No current facility-administered medications for this visit.    No Known Allergies   Review of Systems: All systems reviewed and negative except where noted in HPI.    No results found. No recent labs available  Physical Exam: BP (!) 120/52   Pulse 62   Ht 5' (1.524 m)   Wt 175 lb (79.4 kg)   BMI 34.18 kg/m  Constitutional: Pleasant, female in no acute distress, ambulates with walker HEENT: Normocephalic and atraumatic. Conjunctivae are normal. No scleral icterus. Neck supple.  Cardiovascular:  Normal rate, regular rhythm.  Pulmonary/chest: Effort normal and breath sounds normal. No wheezing, rales or rhonchi. Abdominal: Soft, nondistended, nontender. . There are no masses palpable. No hepatomegaly. Extremities: no edema Lymphadenopathy: No cervical adenopathy noted. Neurological: Alert and oriented to person place and time. Skin: Skin is warm and dry. No rashes noted. Psychiatric: Normal mood and affect. Behavior is normal.   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 She has no concerning symptoms. I discussed stool based testing versus optical colonoscopy, and discussed the risks and benefits of each. Her preference is to have an optical colonoscopy. I have asked for her basic lab work to ensure stable on Rituximab. Otherwise she prefers to use low volume Clenpiq as her preparation. Further recommendations pending the results. All questions answered.  [place]af Cellar, MD [facility] Pager [phone]  CC: Levin Erp, MD

## 2017-04-13 NOTE — Patient Instructions (Addendum)
If you are age 69 or older, your body mass index should be between 23-30. Your Body mass index is 34.18 kg/m. If this is out of the aforementioned range listed, please consider follow up with your Primary Care Provider.  If you are age 21 or younger, your body mass index should be between 19-25. Your Body mass index is 34.18 kg/m. If this is out of the aformentioned range listed, please consider follow up with your Primary Care Provider.   You have been scheduled for a colonoscopy. Please follow written instructions given to you at your visit today.  Please pick up your prep supplies at the pharmacy within the next 1-3 days. If you use inhalers (even only as needed), please bring them with you on the day of your procedure. Your physician has requested that you go to www.startemmi.com and enter the access code given to you at your visit today. This web site gives a general overview about your procedure. However, you should still follow specific instructions given to you by our office regarding your preparation for the procedure.  We have given you a sample of Clenpiq today.  We will send for your labs from Dr. Nyoka Cowden.  Thank you.

## 2017-04-16 ENCOUNTER — Telehealth: Payer: Self-pay | Admitting: Gastroenterology

## 2017-04-16 NOTE — Telephone Encounter (Signed)
Labwork obtained from primary care:  02/10/2017: WBC 4.7, Hgb 12.4, HCT 39.4, plt 246, MCV 87

## 2017-05-24 ENCOUNTER — Ambulatory Visit (AMBULATORY_SURGERY_CENTER): Payer: Medicare Other | Admitting: Gastroenterology

## 2017-05-24 ENCOUNTER — Other Ambulatory Visit: Payer: Self-pay

## 2017-05-24 ENCOUNTER — Encounter: Payer: Self-pay | Admitting: Gastroenterology

## 2017-05-24 VITALS — BP 125/61 | HR 64 | Temp 97.8°F | Resp 12 | Ht 60.0 in | Wt 175.0 lb

## 2017-05-24 DIAGNOSIS — Z1211 Encounter for screening for malignant neoplasm of colon: Secondary | ICD-10-CM | POA: Diagnosis present

## 2017-05-24 DIAGNOSIS — D120 Benign neoplasm of cecum: Secondary | ICD-10-CM | POA: Diagnosis not present

## 2017-05-24 DIAGNOSIS — D122 Benign neoplasm of ascending colon: Secondary | ICD-10-CM

## 2017-05-24 DIAGNOSIS — D123 Benign neoplasm of transverse colon: Secondary | ICD-10-CM | POA: Diagnosis not present

## 2017-05-24 MED ORDER — SODIUM CHLORIDE 0.9 % IV SOLN: 500.0000 mL | Freq: Once | INTRAVENOUS | Status: AC

## 2017-05-24 NOTE — Patient Instructions (Signed)
Information given on polyps and hemorrhoids.  YOU HAD AN ENDOSCOPIC PROCEDURE TODAY AT [facility]:   Refer to the procedure report that was given to you for any specific questions about what was found during the examination.  If the procedure report does not answer your questions, please call your gastroenterologist to clarify.  If you requested that your care partner not be given the details of your procedure findings, then the procedure report has been included in a sealed envelope for you to review at your convenience later.  YOU SHOULD EXPECT: Some feelings of bloating in the abdomen. Passage of more gas than usual.  Walking can help get rid of the air that was put into your GI tract during the procedure and reduce the bloating. If you had a lower endoscopy (such as a colonoscopy or flexible sigmoidoscopy) you may notice spotting of blood in your stool or on the toilet paper. If you underwent a bowel prep for your procedure, you may not have a normal bowel movement for a few days.  Please Note:  You might notice some irritation and congestion in your nose or some drainage.  This is from the oxygen used during your procedure.  There is no need for concern and it should clear up in a day or so.  SYMPTOMS TO REPORT IMMEDIATELY:   Following lower endoscopy (colonoscopy or flexible sigmoidoscopy):  Excessive amounts of blood in the stool  Significant tenderness or worsening of abdominal pains  Swelling of the abdomen that is new, acute  Fever of 100F or higher  For urgent or emergent issues, a gastroenterologist can be reached at any hour by calling [phone].   DIET:  We do recommend a small meal at first, but then you may proceed to your regular diet.  Drink plenty of fluids but you should avoid alcoholic beverages for 24 hours.  ACTIVITY:  You should plan to take it easy for the rest of today and you should NOT DRIVE or use heavy machinery until tomorrow (because of  the sedation medicines used during the test).    FOLLOW UP: Our staff will call the number listed on your records the next business day following your procedure to check on you and address any questions or concerns that you may have regarding the information given to you following your procedure. If we do not reach you, we will leave a message.  However, if you are feeling well and you are not experiencing any problems, there is no need to return our call.  We will assume that you have returned to your regular daily activities without incident.  If any biopsies were taken you will be contacted by phone or by letter within the next 1-3 weeks.  Please call us at [phone] if you have not heard about the biopsies in 3 weeks.    SIGNATURES/CONFIDENTIALITY: You and/or your care partner have signed paperwork which will be entered into your electronic medical record.  These signatures attest to the fact that that the information above on your After Visit Summary has been reviewed and is understood.  Full responsibility of the confidentiality of this discharge information lies with you and/or your care-partner.

## 2017-05-24 NOTE — Op Note (Signed)
[place] Patient Name: Kristie Porter Procedure Date: 05/24/2017 3:05 PM MRN: [id number] Endoscopist: Remo Lipps P. Raekwon Winkowski MD, MD Age: 72 Referring MD:  Date of Birth: 09-09-1945 Gender: Female Account #: [id number] Procedure:                Colonoscopy Indications:              Screening for colorectal malignant neoplasm Medicines:                Monitored Anesthesia Care Procedure:                Pre-Anesthesia Assessment:                           - Prior to the procedure, a History and Physical                            was performed, and patient medications and                            allergies were reviewed. The patient's tolerance of                            previous anesthesia was also reviewed. The risks                            and benefits of the procedure and the sedation                            options and risks were discussed with the patient.                            All questions were answered, and informed consent                            was obtained. Prior Anticoagulants: The patient has                            taken no previous anticoagulant or antiplatelet                            agents. ASA Grade Assessment: II - A patient with                            mild systemic disease. After reviewing the risks                            and benefits, the patient was deemed in                            satisfactory condition to undergo the procedure.                           After obtaining informed consent, the colonoscope  was passed under direct vision. Throughout the                            procedure, the patient's blood pressure, pulse, and                            oxygen saturations were monitored continuously. The                            Model PCF-[id number]) scope was introduced                            through the anus and advanced to the the cecum,   identified by appendiceal orifice and ileocecal                            valve. The colonoscopy was performed without                            difficulty. The patient tolerated the procedure                            well. The quality of the bowel preparation was                            adequate. The ileocecal valve, appendiceal orifice,                            and rectum were photographed. Scope In: 3:22:23 PM Scope Out: 6:01:09 PM Scope Withdrawal Time: 0 hours 21 minutes 30 seconds  Total Procedure Duration: 0 hours 23 minutes 41 seconds  Findings:                 The perianal and digital rectal examinations were                            normal.                           Two sessile polyps were found in the cecum. The                            polyps were 3 to 6 mm in size. These polyps were                            removed with a cold snare. Resection and retrieval                            were complete.                           A 4 mm polyp was found in the ascending colon. The                            polyp was sessile.  The polyp was removed with a                            cold snare. Resection and retrieval were complete.                           A 5 mm polyp was found in the transverse colon. The                            polyp was sessile. The polyp was removed with a                            cold snare. Resection and retrieval were complete.                           Internal hemorrhoids were found during retroflexion.                           Anal papilla(e) were hypertrophied.                           The exam was otherwise without abnormality. Complications:            No immediate complications. Estimated blood loss:                            Minimal. Estimated Blood Loss:     Estimated blood loss was minimal. Impression:               - Two 3 to 6 mm polyps in the cecum, removed with a                            cold snare. Resected and  retrieved.                           - One 4 mm polyp in the ascending colon, removed                            with a cold snare. Resected and retrieved.                           - One 5 mm polyp in the transverse colon, removed                            with a cold snare. Resected and retrieved.                           - Internal hemorrhoids.                           - Anal papilla(e) were hypertrophied.                           - The examination was otherwise normal. Recommendation:           -  Patient has a contact number available for                            emergencies. The signs and symptoms of potential                            delayed complications were discussed with the                            patient. Return to normal activities tomorrow.                            Written discharge instructions were provided to the                            patient.                           - Resume previous diet.                           - Continue present medications.                           - Await pathology results.                           - Repeat colonoscopy is recommended for                            surveillance. The colonoscopy date will be                            determined after pathology results from today's                            exam become available for review.                           - No ibuprofen, naproxen, or other non-steroidal                            anti-inflammatory drugs for 2 weeks after polyp                            removal. Remo Lipps P. Kissa Campoy MD, MD 05/24/2017 3:50:32 PM This report has been signed electronically.

## 2017-05-24 NOTE — Progress Notes (Signed)
Patient does not know the names of her medications but reports that she took them all last night and there has been no change in her med list since her visit on 04/13/17 with Dr. Havery Moros. Sm

## 2017-05-24 NOTE — Progress Notes (Signed)
Called to room to assist during endoscopic procedure.  Patient ID and intended procedure confirmed with present staff. Received instructions for my participation in the procedure from the performing physician.  

## 2017-05-24 NOTE — Progress Notes (Signed)
To PACU, VSS. Report to [email or web address] 

## 2017-05-25 ENCOUNTER — Telehealth: Payer: Self-pay | Admitting: *Deleted

## 2017-05-25 NOTE — Telephone Encounter (Signed)
  Follow up Call-  Call back number 05/24/2017  Post procedure Call Back phone  # [phone]  Permission to leave phone message Yes  Some recent data might be hidden     Patient questions:  Do you have a fever, pain , or abdominal swelling? No. Pain Score  0 *  Have you tolerated food without any problems? Yes.    Have you been able to return to your normal activities? Yes.    Do you have any questions about your discharge instructions: Diet   No. Medications  No. Follow up visit  No.  Do you have questions or concerns about your Care? No.  Actions: * If pain score is 4 or above: No action needed, pain <4.

## 2017-05-28 ENCOUNTER — Encounter: Payer: Self-pay | Admitting: Gastroenterology

## 2017-07-09 ENCOUNTER — Other Ambulatory Visit: Payer: Self-pay | Admitting: Family Medicine

## 2017-07-09 DIAGNOSIS — M25511 Pain in right shoulder: Secondary | ICD-10-CM

## 2017-07-12 ENCOUNTER — Ambulatory Visit
Admission: RE | Admit: 2017-07-12 | Discharge: 2017-07-12 | Disposition: A | Discharge status: Home / Self Care | Payer: Medicare Other | Source: Ambulatory Visit | Attending: Family Medicine | Admitting: Family Medicine

## 2017-07-12 DIAGNOSIS — M25511 Pain in right shoulder: Secondary | ICD-10-CM

## 2019-04-11 ENCOUNTER — Other Ambulatory Visit: Payer: Self-pay

## 2019-04-11 DIAGNOSIS — Z20822 Contact with and (suspected) exposure to covid-19: Secondary | ICD-10-CM

## 2019-04-13 LAB — NOVEL CORONAVIRUS, NAA: SARS-CoV-2, NAA: NOT DETECTED

## 2020-07-11 ENCOUNTER — Telehealth: Payer: Self-pay | Admitting: Infectious Diseases

## 2020-07-11 NOTE — Telephone Encounter (Signed)
I connected by phone with Kristie Porter on 07/11/2020, 2:33 PM to discuss the potential use of a new treatment, tixagevimab/cilgavimab, for pre-exposure prophylaxis for prevention of coronavirus disease 2019 (COVID-19) caused by the SARS-CoV-2 virus.  This patient is a 75 y.o. female that meets the FDA criteria for Emergency Use Authorization of tixagevimab/cilgavimab for pre-exposure prophylaxis of COVID-19 disease. Pt meets following criteria:  Age >12 yr and weight > 40kg  Not currently infected with SARS-CoV-2 and has no known recent exposure to an individual infected with SARS-CoV-2 AND o Who has moderate to severe immune compromise due to a medical condition or receipt of immunosuppressive medications or treatments and may not mount an adequate immune response to COVID-19 vaccination or  o Vaccination with any available COVID-19 vaccine, according to the approved or authorized schedule, is not recommended due to a history of severe adverse reaction (e.g., severe allergic reaction) to a COVID-19 vaccine(s) and/or COVID-19 vaccine component(s).  o Patient meets the following definition of mod-severe immune compromised status: 8. Other groups not previously mentioned: 4. Wagners granulomatosis  I have spoken and communicated the following to the patient or parent/caregiver regarding COVID monoclonal antibody treatment:  1. FDA has authorized the emergency use of tixagevimab/cilgavimab for the pre-exposure prophylaxis of COVID-19 in patients with moderate-severe immunocompromised status, who meet above EUA criteria.  2. The significant known and potential risks and benefits of COVID monoclonal antibody, and the extent to which such potential risks and benefits are unknown.  3. Information on available alternative treatments and the risks and benefits of those alternatives, including clinical trials.  4. The patient or parent/caregiver has the option to accept or refuse COVID monoclonal antibody  treatment.  After reviewing this information with the patient she would like to take time to think about this. She is very worried about needles and not sure she wants to proceed. I gave her my work cell phone so she has a way to contact me.   Kristie Madeira, NP, 07/11/2020, 2:33 PM

## 2020-12-09 ENCOUNTER — Encounter: Payer: Self-pay | Admitting: Gastroenterology

## 2020-12-24 ENCOUNTER — Encounter: Payer: Self-pay | Admitting: Gastroenterology

## 2020-12-26 ENCOUNTER — Ambulatory Visit (INDEPENDENT_AMBULATORY_CARE_PROVIDER_SITE_OTHER): Payer: Medicare Other | Admitting: Internal Medicine

## 2020-12-26 ENCOUNTER — Other Ambulatory Visit: Payer: Self-pay

## 2020-12-26 ENCOUNTER — Encounter: Payer: Self-pay | Admitting: Internal Medicine

## 2020-12-26 VITALS — BP 116/58 | HR 71 | Temp 97.6°F | Ht 60.0 in | Wt 153.0 lb

## 2020-12-26 DIAGNOSIS — R053 Chronic cough: Secondary | ICD-10-CM

## 2020-12-26 DIAGNOSIS — D849 Immunodeficiency, unspecified: Secondary | ICD-10-CM | POA: Diagnosis not present

## 2020-12-26 DIAGNOSIS — R059 Cough, unspecified: Secondary | ICD-10-CM | POA: Diagnosis not present

## 2020-12-26 LAB — NITRIC OXIDE: Nitric Oxide: 24

## 2020-12-26 NOTE — Patient Instructions (Addendum)
ICD-10-CM   1. Chronic cough  R05.3     2. Cough  R05.9 Nitric oxide    3. 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  Generally cough neuropathy of chronic refractory cough without etiology causes problem with quality of life  Currently it is mild  Other possibilities of cough include opportunistic infection given immunosuppressed status, cough variant asthma [current probability is low given normal nitric oxide test], pulmonary fibrosis, or vasculitis or lung cancer [clear chest x-ray recently per [place] a shared decision making to follow expectantly -Took a shared decision making to hold off in getting any CT chest or blood allergy work-up -Took a shared decision making that if cough changes then we will get more testing especially given immunosuppressed status  Follow-up - Return in 6 months or sooner if needed
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
## 2021-01-06 ENCOUNTER — Other Ambulatory Visit (HOSPITAL_COMMUNITY): Payer: Self-pay | Admitting: *Deleted

## 2021-01-07 ENCOUNTER — Encounter (HOSPITAL_COMMUNITY): Payer: Medicare Other

## 2021-01-17 ENCOUNTER — Ambulatory Visit (HOSPITAL_COMMUNITY)
Admission: RE | Admit: 2021-01-17 | Discharge: 2021-01-17 | Disposition: A | Discharge status: Home / Self Care | Payer: Medicare Other | Source: Ambulatory Visit | Attending: Internal Medicine | Admitting: Internal Medicine

## 2021-01-17 ENCOUNTER — Other Ambulatory Visit: Payer: Self-pay

## 2021-01-17 DIAGNOSIS — M810 Age-related osteoporosis without current pathological fracture: Secondary | ICD-10-CM | POA: Diagnosis not present

## 2021-01-17 MED ORDER — ZOLEDRONIC ACID 5 MG/100ML IV SOLN
INTRAVENOUS | Status: AC
  Administered 2021-01-17: 5 mg via INTRAVENOUS
  Filled 2021-01-17: qty 100

## 2021-01-17 MED ORDER — ZOLEDRONIC ACID 5 MG/100ML IV SOLN: 5.0000 mg | Freq: Once | INTRAVENOUS | Status: AC

## 2021-02-10 ENCOUNTER — Ambulatory Visit (AMBULATORY_SURGERY_CENTER): Payer: Medicare Other | Admitting: *Deleted

## 2021-02-10 ENCOUNTER — Other Ambulatory Visit: Payer: Self-pay

## 2021-02-10 VITALS — Ht 60.0 in | Wt 149.0 lb

## 2021-02-10 DIAGNOSIS — Z86010 Personal history of colonic polyps: Secondary | ICD-10-CM

## 2021-02-10 MED ORDER — CLENPIQ 10-3.5-12 MG-GM -GM/160ML PO SOLN: 1.0000 | ORAL | 0 refills | Status: DC

## 2021-02-10 NOTE — Progress Notes (Signed)
Pt verified name, DOB, address and insurance during PV today.  Pt mailed instruction packet of Emmi video, copy of consent form to read and not return, and instructions.   PV completed over the phone.  Pt encouraged to call with questions or issues.  My Chart instructions to pt as well    No egg or soy allergy known to patient  No issues known to pt with past sedation with any surgeries or procedures Patient denies ever being told they had issues or difficulty with intubation  No FH of Malignant Hyperthermia Pt is not on diet pills Pt is not on  home 02  Pt is not on blood thinners  Pt denies issues with constipation  No A fib or A flutter   Pt is fully vaccinated  for Covid   Due to the COVID-19 pandemic we are asking patients to follow certain guidelines.  Pt aware of COVID protocols and [facility] guidelines

## 2021-02-24 ENCOUNTER — Ambulatory Visit (AMBULATORY_SURGERY_CENTER): Payer: Medicare Other | Admitting: Gastroenterology

## 2021-02-24 ENCOUNTER — Encounter: Payer: Self-pay | Admitting: Gastroenterology

## 2021-02-24 ENCOUNTER — Other Ambulatory Visit: Payer: Self-pay

## 2021-02-24 VITALS — BP 152/77 | HR 65 | Temp 97.5°F | Resp 11 | Ht 60.0 in | Wt 150.0 lb

## 2021-02-24 DIAGNOSIS — Z86010 Personal history of colonic polyps: Secondary | ICD-10-CM

## 2021-02-24 DIAGNOSIS — K621 Rectal polyp: Secondary | ICD-10-CM | POA: Diagnosis not present

## 2021-02-24 DIAGNOSIS — D122 Benign neoplasm of ascending colon: Secondary | ICD-10-CM | POA: Diagnosis not present

## 2021-02-24 DIAGNOSIS — D125 Benign neoplasm of sigmoid colon: Secondary | ICD-10-CM | POA: Diagnosis not present

## 2021-02-24 DIAGNOSIS — D127 Benign neoplasm of rectosigmoid junction: Secondary | ICD-10-CM | POA: Diagnosis not present

## 2021-02-24 MED ORDER — SODIUM CHLORIDE 0.9 % IV SOLN: 500.0000 mL | Freq: Once | INTRAVENOUS | Status: DC

## 2021-02-24 NOTE — Progress Notes (Signed)
[facility] History and Physical   Primary Care Physician:  Michael Boston, MD   Reason for Procedure:   History of colon polyps  Plan:    colonoscopy     HPI: Kristie Porter is a 75 y.o. female  here for colonoscopy surveillance - 4 pre-cancerous polyps removed 05/2017. Patient denies any bowel symptoms at this time. No family history of colon cancer known. Otherwise feels well without any cardiopulmonary symptoms.    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 Authorizing Provider  escitalopram (LEXAPRO) 10 MG tablet  02/04/17  Yes [provider]  gabapentin (NEURONTIN) 300 MG capsule Take 300 mg by mouth 3 (three) times daily.   Yes [provider]  oxybutynin (DITROPAN-XL) 10 MG 24 hr tablet  04/06/17  Yes [provider]  pramipexole (MIRAPEX) 0.125 MG tablet  03/08/17  Yes [provider]  rosuvastatin  (CRESTOR) 10 MG tablet  02/18/17  Yes [provider]  acetaminophen (TYLENOL) 325 MG tablet Take by mouth.    [provider]  fluticasone (FLONASE) 50 MCG/ACT nasal spray PLACE 2 SPRAYS IN EACH NOSTRIL ONCE A DAY Patient not taking: Reported on 02/10/2021    [provider]  pantoprazole (PROTONIX) 40 MG tablet Take 40 mg by mouth daily.    [provider]  riTUXimab in sodium chloride 0.9 % 250 mL Inject into the vein once. every 6 months    [provider]    Current Outpatient Medications  Medication Sig Dispense Refill   escitalopram (LEXAPRO) 10 MG tablet      gabapentin (NEURONTIN) 300 MG capsule Take 300 mg by mouth 3 (three) times daily.     oxybutynin (DITROPAN-XL) 10 MG 24 hr tablet      pramipexole (MIRAPEX) 0.125 MG tablet      rosuvastatin (CRESTOR) 10 MG tablet      acetaminophen (TYLENOL) 325 MG tablet Take by mouth.     fluticasone (FLONASE) 50 MCG/ACT nasal spray PLACE 2 SPRAYS IN EACH NOSTRIL ONCE A DAY (Patient not taking: Reported on 02/10/2021)     pantoprazole (PROTONIX) 40 MG tablet Take 40 mg by mouth daily.     riTUXimab in sodium chloride 0.9 % 250 mL Inject into the vein  once. every 6 months     Current Facility-Administered Medications  Medication Dose Route Frequency Provider Last Rate Last Admin   0.9 %  sodium chloride infusion  500 mL Intravenous Once Victoria Henshaw, Carlota Raspberry, MD       0.9 %  sodium chloride infusion  500 mL Intravenous Once Chau Sawin, Carlota Raspberry, MD        Allergies as of 02/24/2021 - Review Complete 02/24/2021  Allergen Reaction Noted   Oxymorphone  12/14/2014    Family History  Problem Relation Age of Onset   Hypertension Mother    Alzheimer's disease Mother    Hypertension Father    Diabetes Father    Colon cancer Paternal Grandmother    Stomach cancer Neg Hx    Rectal cancer Neg Hx    Colon polyps Neg Hx    Heart disease Neg Hx     Social History   Socioeconomic History    Marital status: Married    Spouse name: Not on file   Number of children: 2   Years of education: master's   Highest education level: Not on file  Occupational History   Not on file  Tobacco Use   Smoking status: Never   Smokeless tobacco: Never  Vaping Use   Vaping Use: Never used  Substance and Sexual Activity   Alcohol use: Yes    Comment: rare   Drug use: No   Sexual activity: Never  Other Topics Concern   Not on file  Social History Narrative   Patient is right handed.   Patient drinks 1 cup of caffeine daily.   Social Determinants of Health   Financial Resource Strain: Not on file  Food Insecurity: Not on file  Transportation Needs: Not on file  Physical Activity: Not on file  Stress: Not on file  Social Connections: Not on file  Intimate Partner Violence: Not on file    Review of Systems: All other review of systems negative except as mentioned in the HPI.  Physical Exam: Vital signs BP (!) 180/90   Pulse 65   Temp (!) 97.5 F (36.4 C) (Temporal)   Resp 16   Ht 5' (1.524 m)   Wt 150 lb (68 kg)   SpO2 95%   BMI 29.29 kg/m   General:   Alert,  Well-developed, pleasant and cooperative in NAD Lungs:  Clear throughout to auscultation.   Heart:  Regular rate and rhythm Abdomen:  Soft, nontender and nondistended.   Neuro/Psych:  Alert and cooperative. Normal mood and affect. A and O x 3  Jolly Mango, MD [facility]

## 2021-02-24 NOTE — Progress Notes (Signed)
Called to room to assist during endoscopic procedure.  Patient ID and intended procedure confirmed with present staff. Received instructions for my participation in the procedure from the performing physician.  

## 2021-02-24 NOTE — Patient Instructions (Signed)
Information on polyps and hemorrhoids given to you today.  Await pathology results.  Resume previous diet and medications.   YOU HAD AN ENDOSCOPIC PROCEDURE TODAY AT [facility]:   Refer to the procedure report that was given to you for any specific questions about what was found during the examination.  If the procedure report does not answer your questions, please call your gastroenterologist to clarify.  If you requested that your care partner not be given the details of your procedure findings, then the procedure report has been included in a sealed envelope for you to review at your convenience later.  YOU SHOULD EXPECT: Some feelings of bloating in the abdomen. Passage of more gas than usual.  Walking can help get rid of the air that was put into your GI tract during the procedure and reduce the bloating. If you had a lower endoscopy (such as a colonoscopy or flexible sigmoidoscopy) you may notice spotting of blood in your stool or on the toilet paper. If you underwent a bowel prep for your procedure, you may not have a normal bowel movement for a few days.  Please Note:  You might notice some irritation and congestion in your nose or some drainage.  This is from the oxygen used during your procedure.  There is no need for concern and it should clear up in a day or so.  SYMPTOMS TO REPORT IMMEDIATELY:  Following lower endoscopy (colonoscopy or flexible sigmoidoscopy):  Excessive amounts of blood in the stool  Significant tenderness or worsening of abdominal pains  Swelling of the abdomen that is new, acute  Fever of 100F or higher  For urgent or emergent issues, a gastroenterologist can be reached at any hour by calling [phone]. Do not use MyChart messaging for urgent concerns.    DIET:  We do recommend a small meal at first, but then you may proceed to your regular diet.  Drink plenty of fluids but you should avoid alcoholic beverages for 24  hours.  ACTIVITY:  You should plan to take it easy for the rest of today and you should NOT DRIVE or use heavy machinery until tomorrow (because of the sedation medicines used during the test).    FOLLOW UP: Our staff will call the number listed on your records 48-72 hours following your procedure to check on you and address any questions or concerns that you may have regarding the information given to you following your procedure. If we do not reach you, we will leave a message.  We will attempt to reach you two times.  During this call, we will ask if you have developed any symptoms of COVID 19. If you develop any symptoms (ie: fever, flu-like symptoms, shortness of breath, cough etc.) before then, please call [phone].  If you test positive for Covid 19 in the 2 weeks post procedure, please call and report this information to [place].    If any biopsies were taken you will be contacted by phone or by letter within the next 1-3 weeks.  Please call us at [phone] if you have not heard about the biopsies in 3 weeks.    SIGNATURES/CONFIDENTIALITY: You and/or your care partner have signed paperwork which will be entered into your electronic medical record.  These signatures attest to the fact that that the information above on your After Visit Summary has been reviewed and is understood.  Full responsibility of the confidentiality of this discharge information lies with you and/or your  care-partner.  

## 2021-02-24 NOTE — Op Note (Signed)
[facility] Patient Name: Kristie Porter Procedure Date: 02/24/2021 10:44 AM MRN: [id number] Endoscopist: Remo Lipps P. Havery Moros , MD Age: 75 Referring MD:  Date of Birth: 02/07/1946 Gender: Female Account #: [id number] Procedure:                Colonoscopy Indications:              High risk colon cancer surveillance: Personal                            history of colonic polyps - 4 polyps (adenomas /                            sessile serrated) 05/2017 Medicines:                Monitored Anesthesia Care Procedure:                Pre-Anesthesia Assessment:                           - Prior to the procedure, a History and Physical                            was performed, and patient medications and                            allergies were reviewed. The patient's tolerance of                            previous anesthesia was also reviewed. The risks                            and benefits of the procedure and the sedation                            options and risks were discussed with the patient.                            All questions were answered, and informed consent                            was obtained. Prior Anticoagulants: The patient has                            taken no previous anticoagulant or antiplatelet                            agents. ASA Grade Assessment: II - A patient with                            mild systemic disease. After reviewing the risks                            and benefits, the patient was deemed in  satisfactory condition to undergo the procedure.                           After obtaining informed consent, the colonoscope                            was passed under direct vision. Throughout the                            procedure, the patient's blood pressure, pulse, and                            oxygen saturations were monitored continuously. The                            Olympus PCF-H190DL (#[id number])  Colonoscope was                            introduced through the anus and advanced to the the                            cecum, identified by appendiceal orifice and                            ileocecal valve. The colonoscopy was performed                            without difficulty. The patient tolerated the                            procedure well. The quality of the bowel                            preparation was adequate. The ileocecal valve,                            appendiceal orifice, and rectum were photographed. Scope In: 10:50:44 AM Scope Out: 11:11:28 AM Scope Withdrawal Time: 0 hours 14 minutes 47 seconds  Total Procedure Duration: 0 hours 20 minutes 44 seconds  Findings:                 The perianal and digital rectal examinations were                            normal.                           A 4 mm polyp was found in the ascending colon. The                            polyp was sessile. The polyp was removed with a                            cold snare. Resection and retrieval were complete.  A 3 mm polyp was found in the recto-sigmoid colon.                            The polyp was sessile. The polyp was removed with a                            cold snare. Resection and retrieval were complete.                           A diminutive polyp was found in the rectum. The                            polyp was sessile. The polyp was removed with a                            cold snare. Resection and retrieval were complete.                           Internal hemorrhoids were found during retroflexion.                           The exam was otherwise without abnormality. Complications:            No immediate complications. Estimated blood loss:                            Minimal. Estimated Blood Loss:     Estimated blood loss was minimal. Impression:               - One 4 mm polyp in the ascending colon, removed                            with  a cold snare. Resected and retrieved.                           - One 3 mm polyp at the recto-sigmoid colon,                            removed with a cold snare. Resected and retrieved.                           - One diminutive polyp in the rectum, removed with                            a cold snare. Resected and retrieved.                           - Internal hemorrhoids.                           - The examination was otherwise normal. Recommendation:           - Patient has a contact number available for  emergencies. The signs and symptoms of potential                            delayed complications were discussed with the                            patient. Return to normal activities tomorrow.                            Written discharge instructions were provided to the                            patient.                           - Resume previous diet.                           - Continue present medications.                           - Await pathology results. Remo Lipps P. Kristie Sweeting, MD 02/24/2021 11:15:29 AM This report has been signed electronically.

## 2021-02-24 NOTE — Progress Notes (Signed)
Report given to PACU, vss 

## 2021-02-24 NOTE — Progress Notes (Signed)
Pt's states no medical or surgical changes since previsit or office visit.   DT vitals. 

## 2021-02-26 ENCOUNTER — Telehealth: Payer: Self-pay | Admitting: *Deleted

## 2021-02-26 ENCOUNTER — Telehealth: Payer: Self-pay

## 2021-02-26 NOTE — Telephone Encounter (Signed)
Attempted to reach patient for post-procedure f/u call. No answer. Left message that we will make another attempt to reach her later today and for her to please not hesitate to call us if she has any questions/concerns regarding her care. 

## 2021-02-26 NOTE — Telephone Encounter (Signed)
  Follow up Call-  Call back number 02/24/2021  Post procedure Call Back phone  # [phone]  Permission to leave phone message Yes  Some recent data might be hidden     Patient questions:  Do you have a fever, pain , or abdominal swelling? No. Pain Score  0 *  Have you tolerated food without any problems? Yes.    Have you been able to return to your normal activities? Yes.    Do you have any questions about your discharge instructions: Diet   No. Medications  No. Follow up visit  No.  Do you have questions or concerns about your Care? No.  Actions: * If pain score is 4 or above: No action needed, pain <4.   Have you developed a fever since your procedure? no  2.   Have you had an respiratory symptoms (SOB or cough) since your procedure? no  3.   Have you tested positive for COVID 19 since your procedure no  4.   Have you had any family members/close contacts diagnosed with the COVID 19 since your procedure?  no   If yes to any of these questions please route to Joylene John, RN and Joella Prince, RN

## 2021-06-10 ENCOUNTER — Ambulatory Visit: Payer: Medicare Other | Admitting: Primary Care

## 2021-06-13 ENCOUNTER — Encounter: Payer: Self-pay | Admitting: Primary Care

## 2021-06-13 ENCOUNTER — Ambulatory Visit: Payer: Medicare Other | Admitting: Primary Care

## 2021-06-13 ENCOUNTER — Other Ambulatory Visit: Payer: Self-pay

## 2021-06-13 ENCOUNTER — Ambulatory Visit (INDEPENDENT_AMBULATORY_CARE_PROVIDER_SITE_OTHER): Payer: Medicare Other

## 2021-06-13 VITALS — BP 124/66 | HR 66 | Temp 98.2°F | Ht 60.0 in | Wt 157.0 lb

## 2021-06-13 DIAGNOSIS — J9690 Respiratory failure, unspecified, unspecified whether with hypoxia or hypercapnia: Secondary | ICD-10-CM | POA: Insufficient documentation

## 2021-06-13 DIAGNOSIS — J9601 Acute respiratory failure with hypoxia: Secondary | ICD-10-CM

## 2021-06-13 DIAGNOSIS — R0683 Snoring: Secondary | ICD-10-CM | POA: Diagnosis not present

## 2021-06-13 DIAGNOSIS — J189 Pneumonia, unspecified organism: Secondary | ICD-10-CM | POA: Diagnosis not present

## 2021-06-13 NOTE — Patient Instructions (Addendum)
Recommendations: - You can stop mucinex-DM  (if cough returns try delsym every 12 hours, if still persists then go back to mucinex-dm) - Check O2 saturation at rest on room air and if staying >88% then you do not need use while at rest/sitting  - Check O2 saturation with maximal exertion and if staying >88% then you do not need use with exertoin. If needed to use oxygen try on 1L first to see if you can maintain levels >88%, if unable stay on 2L  - Continue to wear 2L oxygen at bedtime  Orders: - CXR today (ordered) - IN-lab sleep study re: snoring/respiratory failure (ordered)  Follow-up: -6-8 weeks with Dr. Chase Caller for routine follow-up COUGH (15 min slot ok)  Home Oxygen Use, Adult When a medical condition keeps you from getting enough oxygen, your health care provider may instruct you to take extra oxygen at home. Your health care provider will let you know: When to take oxygen. How long to take oxygen. How quickly oxygen should be delivered (flow rate), in liters per minute (LPM or L/M). Home oxygen can be given through: A mask. A nasal cannula. This is a device or tube that goes in the nostrils. A transtracheal catheter. This is a small, thin tube placed in the windpipe (trachea). A breathing tube (tracheostomy tube) that is surgically placed in the windpipe. This may be used in severe cases. These devices are connected with tubing to an oxygen source, such as: A tank. Tanks hold oxygen in gas form. They must be replaced when the oxygen is used up. A liquid oxygen device. This holds oxygen in liquid form. Liquid oxygen is very cold. It must be replaced when the oxygen is used up. An oxygen concentrator machine. This filters oxygen in the room. There are two types of oxygen concentrator machines--stationary and portable. A stationary oxygen concentrator machine plugs into the main electricity supply at your home. You must have a backup cylinder of oxygen in case the power goes  out. A portable oxygen concentrator machine is smaller in size and more lightweight. This machine uses battery supply and can be used outside the home. Work with your health care provider to find equipment that works best for you and your lifestyle. What are the risks? Delivery of supplemental oxygen is generally safe. However, some risks include: Fire. This can happen if the oxygen is exposed to a heat source, flame, or spark. Injury to skin. This can happen if liquid oxygen touches your skin. Damage to the lungs or other organs. This can happen from getting too little or too much oxygen. Supplies needed: To use oxygen, you will need: A mask, nasal cannula, transtracheal catheter, or tracheostomy. An oxygen tank, a liquid oxygen device, or an oxygen concentrator. The tape that your health care provider recommends (optional). Your health care provider may also recommend: A humidifier to warm and moisten the oxygen delivered. This will depend on how much oxygen you need and the type of home oxygen device you use. A pulse oximeter. This device measures the percentage of oxygen in your blood. How to use oxygen Your health care provider or a person from your [place] will show you how to use your oxygen device. Follow his or her instructions. The instructions may look something like this: Wash your hands with soap and water. If you use an oxygen concentrator, make sure it is plugged in. Place one end of the tube into the port on the tank, device, or machine.  Place the mask over your nose and mouth. Or, place the nasal cannula and secure it with tape if instructed. If you use a tracheostomy or transtracheal catheter, connect it to the oxygen source as directed. Make sure the liter-flow setting on the machine is at the level prescribed by your health care provider. Turn on the machine or adjust the knob on the tank or device to the correct liter-flow setting. When you are done, turn  off and unplug the machine, or turn the knob to OFF. How to clean and care for the oxygen supplies Nasal cannula Clean it with a warm, wet cloth daily or as needed. Wash it with a liquid soap once a week. Rinse it thoroughly once or twice a week. Air-dry it. Replace it every 2-4 weeks. If you have an infection, such as a cold or pneumonia, change the cannula when you get better. Mask Replace it every 2-4 weeks. If you have an infection, such as a cold or pneumonia, change the mask when you get better. Humidifier bottle Wash the bottle between each refill: Wash it with soap and warm water. Rinse it thoroughly. Clean it and its top with a disinfectant cleaner. Air-dry it. Make sure it is dry before you refill it. Oxygen concentrator Clean the air filter at least twice a week according to directions from your home medical equipment and service company. Wipe down the cabinet every day. To do this: Unplug the unit. Wipe down the cabinet with a damp cloth. Dry the cabinet. Other equipment Change any extra tubing every 1-3 months. Follow instructions from your health care provider about taking care of any other equipment. Safety tips Fire safety tips  Keep your oxygen and oxygen supplies at least 6 ft (2 m) away from sources of heat, flames, and sparks at all times. Do not allow smoking near your oxygen. Put up "no smoking" signs in your home. Avoid smoking areas when in public. Do not use materials that can burn (are flammable) while you use oxygen. This includes: Petroleum jelly. Hair spray or other aerosol sprays. Rubbing alcohol. Hand sanitizer. When you go to a restaurant with portable oxygen, ask to be seated in the non-smoking section. Keep a Data processing manager close by. Let your fire department know that you have oxygen in your home. Test your home smoke detectors regularly. Traveling Secure your oxygen tank in the vehicle so that it does not move around. Follow instructions  from your medical device company about how to safely secure your tank. Make sure you have enough oxygen for the amount of time you will be away from home. If you are planning to travel by public transportation (airplane, train, bus, or boat), contact the company to find out if it allows the use of an approved portable oxygen concentrator. You may also need documents from your health care provider and medical device company before you travel. General safety tips If you use an oxygen cylinder, make sure it is in a stand or secured to an object that will not move (fixed object). If you use liquid oxygen, make sure its container is kept upright at all times. If you use an oxygen concentrator: Tell Loss adjuster, chartered company. Make sure you are given priority service in the event that your power goes out. Avoid using extension cords if possible. Follow these instructions at home: Use oxygen only as told by your health care provider. Do not use alcohol or other drugs that make you relax (sedating drugs) unless instructed. They  can slow down your breathing rate and make it hard to get in enough oxygen. Know how and when to order a refill of oxygen. Always keep a spare tank of oxygen. Plan ahead for holidays when you may not be able to get a prescription filled. Use water-based lubricants on your lips or nostrils. Do not use oil-based products like petroleum jelly. To prevent skin irritation on your cheeks or behind your ears, tuck some gauze under the tubing. Where to find more information American Lung Association: DiabeticMale.de Contact a health care provider if: You get headaches often. You have a lasting cough. You are restless or have anxiety. You develop an illness that affects your breathing. You cannot exercise at your regular level. You have a fever. You have persistent redness under your nose. Get help right away if: You are confused. You are sleepy all the time. You have blue lips or  fingernails. You have difficult or irregular breathing that is getting worse. You are struggling to breathe. These symptoms may represent a serious problem that is an emergency. Do not wait to see if the symptoms will go away. Get medical help right away. Call your local emergency services (911 in the [place] Do not drive yourself to the hospital. Summary Your health care provider or a person from your [place] will show you how to use your oxygen device. Follow his or her instructions. If you use an oxygen concentrator, make sure it is plugged in. Make sure the liter-flow setting on the machine is at the level prescribed by your health care provider. Use oxygen only as told by your health care provider. Keep your oxygen and oxygen supplies at least 6 ft (2 m) away from sources of heat, flames, and sparks at all times. This information is not intended to replace advice given to you by your health care provider. Make sure you discuss any questions you have with your health care provider. Document Revised: 07/06/2019 Document Reviewed: 05/02/2019 Elsevier Patient Education  2022 [employer].

## 2021-06-13 NOTE — Assessment & Plan Note (Addendum)
-   At risk for sleep apnea. Patient has symptoms of snoring, daytime sleepiness and interrupted sleep. Epworth 13. Patient needs in-lab sleep study d/t respiratory failure.

## 2021-06-13 NOTE — Progress Notes (Signed)
@Patient  ID: Kristie Porter, female    DOB: Oct 12, 1945, 76 y.o.   MRN: [id number]  Chief Complaint  Patient presents with   Hospitalization Follow-up    Hospital follow up. Had covid in December. Wasn't able to maintain oxygen and was week and a really bad cough and went into the hospital for 11 days. Chronic cough for 2 years     Referring provider: Michael Boston, MD  HPI: 76 year old female, never smoked.  Past medical history significant for hypertension, COVID-19, peripheral neuropathy, kidney disease, per lipidemia, wegener's granulomatosis.  Patient of Dr. Chase Porter, seen for initial consult on 12/26/2020 for chronic cough.  Hospitalized 06/01/21-06/11/21 at [facility]  Indication for Admission: acute hypoxemic respiratory failure secondary to COVID 19 virus infection   Hospital Course: Kristie Porter is a 76 y.o. female who was brought to [facility] ED on 06/01/21 by family members because of profound weakness, unable to get out of the bed, fever as high as 102 degrees, and shortness of breath.   A family member tested positive for COVID on 05/14/2021. The patient developed severe weakness with some nausea and vomiting at that time and was considered to be clinically consistent with COVID. She and other family members received ivermectin at that time. Her symptoms subsequently resolved, but she again developed symptoms over the course of the past 7 days.    In the ED, mild hypoxemia responded to 3 L/min of supplemental O2. Temperature 101.4, pulse 94, BP 137/62. WBC 3.9, hemoglobin 12.2. CMP remarkable for sodium 134, potassium 3.6, BUN 44, creatinine 2.58, GFR 19, albumin 3.1. Lactic acid 0.6. COVID test positive. Chest x-ray revealed mild left lower lobe infiltrate.   The patient was admitted and treated for the following:  #Acute hypoxic respiratory failure, multifactorial #COVID-19 PNA w/ secondary bacterial PNA  - Pt was out of window for remdesevir at presentation; D-dimer elevated,  CTAP w/o PE or effusion and stable b/l groundglass opacities.  -Pt was treated with 10 day course of dexamethasone  -Completed 3d azithromycin 500mg , 7 days of rocephin. - oxygen was weaned. She will discharge home on 2 liters/min of supplemental oxygen. She has outpt f/u with pulmonology at end of week.    #Wegener's granulomatosis (granulomatosis with polyangiitis) - Patient receives infusions every 6 months w/ next planned infusion in several weeks and will f/u with rheumatology prior to proceeding - On admission both AST/ALT were WNL  - UA negative blood, 0-2 RBC, 50 protein    06/13/2021- Interim hx  Patient presents today for hospital follow-up/cough.  Patient was admitted to [facility] from 06/01/21-06/11/2021 for hypoxic respiratory failure secondary to COVID and left lower lobe pneumonia.  Patient was originally diagnosed with COVID in December 2022. She presented to [facility] medical center on 06/01/2021 with increased weakness. Dx with pneumonia d/t covid, she was out of window from remdesivir. CTA negative for PE, stable bilateral ground glass opacities. Treated with azithromycin x 3 days, rocephin x 7 days and dexamethasone x 10 days. She was discharged on 2L supplemental oxygen.   She has been doing well since discharged. Breathing has been fine. No shortness of breath. She experiences mild dyspnea when speaking per her daughter. Cough is better overall, almost completely resolved. She has been mucinex DM. She has been using incentrive spirometer daily. There was concern she may of OSA d/t oxygen desaturations noted at night during her most recent hospitalizations. She was discharged on 2L oxygen continuously. She does report snoring,  daytime sleepiness and interrupted sleep. She wakes up on average 3 times during the night to use the restroom. Her Epworth score is 13.    Allergies  Allergen Reactions   Oxymorphone     Other reaction(s):  Dizziness    Immunization History  Administered Date(s) Administered   Influenza, Seasonal, Injecte, Preservative Fre 02/15/2014   Influenza-Unspecified 02/15/2014, 03/31/2021   PFIZER(Purple Top)SARS-COV-2 Vaccination 06/15/2019, 07/11/2019, 02/16/2020    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 "   Chronic kidney disease    II   Decreased ambulation status 09/2014   ' went from ambulating with a cane to being unable to ambulate   Depression    Family history of adverse reaction to anesthesia    difficult to put my mother to sleep "   Hypercholesteremia    Hyperlipidemia    past hx   Hyperplastic colon polyp    Hypertension    past hx   Numbness and tingling of both legs    Peripheral neuropathy, 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     escitalopram (LEXAPRO) 10 MG tablet      fluticasone (FLONASE) 50 MCG/ACT nasal spray      gabapentin (NEURONTIN) 300 MG capsule Take 300 mg by mouth 3 (three) times daily.     oxybutynin (DITROPAN-XL) 10 MG 24 hr tablet      pantoprazole (PROTONIX) 40 MG tablet Take 40 mg by mouth daily.     pramipexole (MIRAPEX) 0.125 MG tablet      riTUXimab in sodium chloride 0.9 % 250 mL Inject into the vein once. every 6 months     rosuvastatin (CRESTOR) 10 MG tablet      Facility-Administered Medications Prior to Visit  Medication Dose Route Frequency Provider Last Rate Last Admin   0.9 %  sodium chloride infusion  500 mL Intravenous Once Armbruster, Carlota Raspberry, MD        Review of  Systems  Review of Systems  Constitutional:  Positive for fatigue.  HENT: Negative.    Respiratory:  Negative for cough, shortness of breath and wheezing.   Cardiovascular: Negative.   Psychiatric/Behavioral:  Positive for sleep disturbance.     Physical Exam  BP 124/66 (BP Location: Right Arm, Patient Position: Sitting, Cuff Size: Normal)    Pulse 66    Temp 98.2 F (36.8 C) (Oral)    Ht 5' (1.524 m)    Wt 157 lb (71.2 kg)    SpO2 98%    BMI 30.66 kg/m  Physical Exam Constitutional:      Appearance: Normal appearance.  HENT:     Head: Normocephalic and atraumatic.  Pulmonary:     Effort: Pulmonary effort is normal. No respiratory distress.     Breath sounds: Rales present. No wheezing or rhonchi.  Musculoskeletal:        General: Normal range of motion.  Skin:    General: Skin is warm and dry.  Neurological:     General: No focal deficit present.  Mental Status: She is alert and oriented to person, place, and time. Mental status is at baseline.  Psychiatric:        Mood and Affect: Mood normal.        Behavior: Behavior normal.        Thought Content: Thought content normal.        Judgment: Judgment normal.     Lab Results:  CBC    Component Value Date/Time   WBC 16.6 (H) 10/04/2014 0935   WBC 7.5 10/01/2014 0506   RBC 4.18 10/04/2014 0935   RBC 3.02 (L) 10/01/2014 0506   HGB 10.6 (L) 10/04/2014 0935   HCT 35.2 10/04/2014 0935   PLT 465 (H) 10/04/2014 0935   MCV 84 10/04/2014 0935   MCH 25.4 (L) 10/04/2014 0935   MCH 25.8 (L) 10/01/2014 0506   MCHC 30.1 (L) 10/04/2014 0935   MCHC 31.1 10/01/2014 0506   RDW 15.9 (H) 10/04/2014 0935   LYMPHSABS 3.2 (H) 10/04/2014 0935   MONOABS 0.4 09/26/2014 1530   EOSABS 0.1 10/04/2014 0935   BASOSABS 0.0 10/04/2014 0935    BMET    Component Value Date/Time   NA 138 10/01/2014 0506   K 3.4 (L) 10/01/2014 0506   CL 106 10/01/2014 0506   CO2 23 10/01/2014 0506   GLUCOSE 150 (H) 10/01/2014 0506   BUN 27 (H)  10/01/2014 0506   CREATININE 0.82 10/01/2014 0506   CALCIUM 8.0 (L) 10/01/2014 0506   GFRNONAA >60 10/01/2014 0506   GFRAA >60 10/01/2014 0506    BNP No results found for: BNP  ProBNP No results found for: PROBNP  Imaging: DG Chest 2 View  Result Date: 06/13/2021 CLINICAL DATA:  COVID pneumonia EXAM: CHEST - 2 VIEW COMPARISON:  Chest x-ray dated April 13, 2017 FINDINGS: Cardiac and mediastinal contours are within normal limits. New peripheral predominant patchy opacities. No pleural effusion or pneumothorax. IMPRESSION: New peripheral predominant patchy opacities, findings can be seen in the setting of COVID pneumonia. Electronically Signed   By: Yetta Glassman M.D.   On: 06/13/2021 11:54     Assessment & Plan:   Multifocal pneumonia - Patient was admitted to [facility] in [place] from 06/01/21-06/11/21 for acute hypoxemic respiratory failure secondary to COVID-19 PNA with secondary bacterial PNA. Originally dx with covid in December 2022. CTA showed stable b/o ground glass opacities.She was out of the window for remdesevir. She was treated with azithromycin/Rocephin and dexamethasone. Discharged on 2L supplemental oxygen.  - Symptomatically she has improved. No significant cough or shortness of breath. CXR today showed peripheral predominant patchy opacities which can be seen in the setting of COVID pneumonia.  Recommend getting repeat CXR in 4 weeks to ensure resolution. No indication for further abx/steriods. She has 3 days left of dexamethasone. FU if resp symptoms worsen.   Respiratory failure (HCC) - Stable; Continues to require 2L supplemental oxygen with exertion and at night.  Snoring - At risk for sleep apnea. Patient has symptoms of snoring, daytime sleepiness and interrupted sleep. Epworth 13. Patient needs in-lab sleep study d/t respiratory failure.   40 mins spent on case: > 50% face to face with patient   Martyn Ehrich, NP 06/13/2021

## 2021-06-13 NOTE — Assessment & Plan Note (Addendum)
-   Stable; Continues to require 2L supplemental oxygen with exertion and at night.

## 2021-06-13 NOTE — Progress Notes (Signed)
Please let patient know CXR still showed some patchy opacities secondary to covid/pna. Needs repeat CXR in 4 weeks. Please order. If cough or sob return call office sooner

## 2021-06-13 NOTE — Assessment & Plan Note (Addendum)
-   Patient was admitted to [facility] in [place] from 06/01/21-06/11/21 for acute hypoxemic respiratory failure secondary to COVID-19 PNA with secondary bacterial PNA. Originally dx with covid in December 2022. CTA showed stable b/o ground glass opacities.She was out of the window for remdesevir. She was treated with azithromycin/Rocephin and dexamethasone. Discharged on 2L supplemental oxygen.  - Symptomatically she has improved. No significant cough or shortness of breath. CXR today showed peripheral predominant patchy opacities which can be seen in the setting of COVID pneumonia.  Recommend getting repeat CXR in 4 weeks to ensure resolution. No indication for further abx/steriods. She has 3 days left of dexamethasone. FU if resp symptoms worsen.

## 2021-06-16 ENCOUNTER — Other Ambulatory Visit: Payer: Self-pay | Admitting: *Deleted

## 2021-06-16 DIAGNOSIS — J189 Pneumonia, unspecified organism: Secondary | ICD-10-CM

## 2021-06-25 ENCOUNTER — Telehealth: Payer: Self-pay | Admitting: Internal Medicine

## 2021-06-25 NOTE — Telephone Encounter (Signed)
Spoke with the pt's daughter  She is aware of directions for using o2 to keep sats above 88% ra  Nothing further needed

## 2021-07-14 ENCOUNTER — Encounter (HOSPITAL_BASED_OUTPATIENT_CLINIC_OR_DEPARTMENT_OTHER): Payer: Medicare Other | Admitting: Pulmonary Disease

## 2021-07-15 ENCOUNTER — Ambulatory Visit (INDEPENDENT_AMBULATORY_CARE_PROVIDER_SITE_OTHER): Payer: Medicare Other

## 2021-07-15 DIAGNOSIS — J189 Pneumonia, unspecified organism: Secondary | ICD-10-CM | POA: Diagnosis not present

## 2021-07-16 NOTE — Progress Notes (Signed)
Please let patient know CXR some improvement in pneumonia at lung bases since 06/13/21. No new or worsening areas

## 2021-07-17 NOTE — Progress Notes (Signed)
Likely still resolving pneumonia with some scarring, should continue to improve but may not completely go away. How is she doing clinically? She has an apt in about 2 weeks with Dr. Chase Caller

## 2021-08-01 ENCOUNTER — Other Ambulatory Visit: Payer: Self-pay

## 2021-08-01 ENCOUNTER — Encounter: Payer: Self-pay | Admitting: Internal Medicine

## 2021-08-01 ENCOUNTER — Ambulatory Visit: Payer: Medicare Other | Admitting: Internal Medicine

## 2021-08-01 VITALS — BP 128/64 | HR 76 | Temp 98.2°F | Ht 60.0 in | Wt 154.2 lb

## 2021-08-01 DIAGNOSIS — R053 Chronic cough: Secondary | ICD-10-CM

## 2021-08-01 DIAGNOSIS — Z8709 Personal history of other diseases of the respiratory system: Secondary | ICD-10-CM

## 2021-08-01 DIAGNOSIS — Z8616 Personal history of COVID-19: Secondary | ICD-10-CM | POA: Diagnosis not present

## 2021-08-01 DIAGNOSIS — D849 Immunodeficiency, unspecified: Secondary | ICD-10-CM | POA: Diagnosis not present

## 2021-08-01 NOTE — Patient Instructions (Addendum)
History of acute respiratory failure ?History of 2019 novel coronavirus disease (COVID-19) - jan 2023 ?Immunosuppressed status ([place]) with Rituxan due to vasculitis ? ?- glad you are so much better ? -Epworth sleepiness questionnaire and stop bang sleep apnea risk questionnaire ? -Will show extremely low risk for sleep apnea ? ?Plan ?- check ONO on room air - if normal then we can discontinue oxygen ?-Hold off on any sleep study referral ?-Do chest x-ray in 3 months ? ? ?Chronic cough -in the summer 2022 ? ?-This resolved after the [facility] admission in January 2023 ? ?Plan  ? - Expectant follow-up ? ?Followup ? - 3  months with nurse practitioner Derl Barrow after chest x-ray ? ?
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
## 2021-08-13 ENCOUNTER — Telehealth: Payer: Self-pay | Admitting: Internal Medicine

## 2021-08-13 NOTE — Telephone Encounter (Signed)
Spoke to patient. She is requesting ONO results. ? ?MR, please advise if you have received results? Thanks ?

## 2021-08-15 NOTE — Telephone Encounter (Signed)
Patient checking on ONO results. Patient phone number is [phone]. ?

## 2021-08-18 NOTE — Telephone Encounter (Signed)
MR please advise:  ? ? ?Patient is calling to see if her results from her ONO have came back ?

## 2021-08-18 NOTE — Telephone Encounter (Signed)
Patient checking on ONO results. Patient phone number is [phone]. ?

## 2021-08-19 ENCOUNTER — Telehealth: Payer: Self-pay | Admitting: Internal Medicine

## 2021-08-19 NOTE — Telephone Encounter (Signed)
See previous telephone encounter.

## 2021-08-19 NOTE — Telephone Encounter (Signed)
MR please advise:  ?  ?  ?Patient is calling to see if her results from her ONO have came back?  ?

## 2021-08-21 ENCOUNTER — Telehealth: Payer: Self-pay | Admitting: Internal Medicine

## 2021-08-21 NOTE — Telephone Encounter (Signed)
I do not have the ONO ?

## 2021-08-21 NOTE — Telephone Encounter (Signed)
MR, please advise if you have reviewed her ONO results.  ?

## 2021-08-22 NOTE — Telephone Encounter (Signed)
Called [employer] and they are closed since today is a holiday. I made pt aware that we will call them next wk. Will hold to call [employer] on 4/10.  ?

## 2021-08-25 NOTE — Telephone Encounter (Signed)
ONO report has been received. With MR out of the office until 4/24, giving this to BW for her to review as pt has seen her prior. ? ?Routing encounter to [employer]. ?

## 2021-08-25 NOTE — Telephone Encounter (Signed)
Called and spoke with patient. I advised patient she needs to be on oxygen at night on 2 liters. She verbalized understanding. Nothing further needed.  ?

## 2021-08-25 NOTE — Telephone Encounter (Signed)
08/09/2021 ONO on room air >> basal SPO2 87%, low SPO2 74%.  Patient spent 3 hours and 51 minutes with SPO2 less than 88%.  Patient will need to be started on nocturnal oxygen at 2 L.

## 2021-08-29 NOTE — Telephone Encounter (Signed)
ONO was received and reviewed by provider of the day. This was handled in a separate encounter. ?

## 2021-08-29 NOTE — Telephone Encounter (Signed)
I do not think I Every saw the ONO results before I left for [place]. IF we are runing out of time please have one of the apps review because I Am not back for another week atleast ?

## 2021-11-05 ENCOUNTER — Encounter: Payer: Self-pay | Admitting: Primary Care

## 2021-11-05 ENCOUNTER — Ambulatory Visit (INDEPENDENT_AMBULATORY_CARE_PROVIDER_SITE_OTHER): Payer: Medicare Other

## 2021-11-05 ENCOUNTER — Ambulatory Visit: Payer: Medicare Other | Admitting: Primary Care

## 2021-11-05 VITALS — BP 112/58 | HR 78 | Temp 98.2°F | Ht 60.0 in | Wt 158.0 lb

## 2021-11-05 DIAGNOSIS — J189 Pneumonia, unspecified organism: Secondary | ICD-10-CM | POA: Diagnosis not present

## 2021-11-05 DIAGNOSIS — R053 Chronic cough: Secondary | ICD-10-CM | POA: Diagnosis not present

## 2021-11-05 DIAGNOSIS — Z8709 Personal history of other diseases of the respiratory system: Secondary | ICD-10-CM | POA: Diagnosis not present

## 2021-11-05 DIAGNOSIS — J9601 Acute respiratory failure with hypoxia: Secondary | ICD-10-CM | POA: Diagnosis not present

## 2021-11-05 DIAGNOSIS — I776 Arteritis, unspecified: Secondary | ICD-10-CM

## 2021-11-05 DIAGNOSIS — Z8616 Personal history of COVID-19: Secondary | ICD-10-CM

## 2021-11-05 NOTE — Assessment & Plan Note (Addendum)
-   No daytime requirements. ONO in March 2023 showed patient spent 3 hours 51 min with SpO2 <88%. She remains on 2L at night. Plan repeat ONO on RA to see if we can discontinue oxygen all together. No overt signs of sleep apnea.

## 2021-11-05 NOTE — Assessment & Plan Note (Addendum)
-   Continue Rituxan per Dr. Amada Jupiter with rheumatology

## 2021-11-05 NOTE — Patient Instructions (Addendum)
Chest x-ray today showed resolution of prior pneumonia  Patient's Continue to wear 2 L of oxygen at bedtime until we get the results of your ONO  Orders: Overnight oximetry on room air re: nocturnal hypoxia   Follow-up: 3 to 6 months with Dr. Chase Caller or sooner if needed

## 2021-11-05 NOTE — Progress Notes (Signed)
$'@Patient'u$  ID: Kristie Porter, female    DOB: October 31, 1945, 76 y.o.   MRN: [id number]  Chief Complaint  Patient presents with   Follow-up    Pt is here for follow up for PNA. Pt states that she is feeling batter since her visit with MR. Pt did have chest xray prior to office visit. Pt is on 2L of oxygen at night time only.     Referring provider: Michael Boston, MD  HPI: 76 year old female, never smoked.  Past medical history significant for multifocal pneumonia, respiratory failure, vasculitis, snoring.  Patient of Dr. Chase Caller  08/01/2021 -   Chief Complaint  Patient presents with   Follow-up    Pt states she has been doing okay since last visit and denies any complaints.   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 Trudie Reed   #Acute respiratory failure with hypoxemia COVID-05 June 2021  HPI Kristie Porter 76 y.o. -presents for follow-up.  Presents with her daughter Junie Panning.  Since her [place] admission in January 2023 she continues to do better.  She did see [facility] practitioner for follow-up and since then she says she is better by leaps and bounds.  The daughter is monitoring her daytime oxygen with exertion [she has baseline unsteady feet because of her vasculitis and uses walker] and in this context she has not needed any oxygen according to the daughter.  Therefore she stopped using nighttime oxygen.  Her walking desaturation test here is baseline pulse ox 97% heart rate of 74.  After 1 lap pulse ox was 94% and heart rate of 105.  This showed a tendency to desaturate but was still adequate.  They really want to come off nighttime oxygen.  I did advise them that she needs overnight test on room air.  They are willing to do this.  The daughter wanted to know about sleep apnea.  Patient denies any snoring.  Patient lives with the deaf husband so they really do not know if she is snoring.  However we did the stop  bang questionnaire and the Epworth sleepiness questionnaire and and both of them she scored 1 suggesting extremely low risk for sleep apnea.  Based on this I advised that there is no need for any sleep study at this point.  Of note I originally saw her in August 2022 for chronic cough and this is resolved.  Resolved after the [place] admission.    11/05/2021 - interim hx  Patient presents today for 3 month follow-up with repeat CXR.  Patient was admitted in January 2023 for COVID/ pneumonia. Chest x-ray in February 2023 showed improved aeration lateral lung base since January.  No new worsening or focal airspace disease.   She continues to feel well. No shortness of breath or significant cough. Repeat CXR today showed resolution of prior bilateral lower lung pneumonia. No daytime oxygen requirements. ONO in March 2023 showed that she spent 3 hours 51 min with SpO2 <88%. She continues to wear 2L oxygen at night, would like to see if she still needs nocturnal oxygen. No concerning symptoms for sleep apnea. Epworth 1. She remains on Rituxan for vasculitis.   Testing:  08/09/2021 ONO on room air >> basal SPO2 87%, low SPO2 74%.  Patient spent 3 hours and 51 minutes with SPO2 less than 88%.  Patient will need to be started on nocturnal oxygen at 2 L.  Allergies  Allergen Reactions  Oxymorphone     Other reaction(s): Dizziness    Immunization History  Administered Date(s) Administered   Influenza, Seasonal, Injecte, Preservative Fre 02/15/2014   Influenza-Unspecified 02/15/2014, 03/31/2021   PFIZER(Purple Top)SARS-COV-2 Vaccination 06/15/2019, 07/11/2019, 02/16/2020    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 "   Chronic kidney disease    II   Decreased ambulation status 09/2014   ' went from ambulating with a cane to being unable to ambulate   Depression    Family history of adverse reaction to anesthesia    difficult to  put my mother to sleep "   Hypercholesteremia    Hyperlipidemia    past hx   Hyperplastic colon polyp    Hypertension    past hx   Numbness and tingling of both legs    Peripheral neuropathy, 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     albuterol (VENTOLIN HFA) 108 (90 Base) MCG/ACT inhaler Inhale 2 puffs into the lungs every 6 (six) hours as needed.     escitalopram (LEXAPRO) 10 MG tablet      gabapentin (NEURONTIN) 300 MG capsule Take 300 mg by mouth 3 (three) times daily.     GEMTESA 75 MG TABS Take 1 tablet by mouth daily.     pramipexole (MIRAPEX) 0.125 MG tablet      riTUXimab in sodium chloride 0.9 % 250 mL Inject into the vein once. every 6 months     rosuvastatin (CRESTOR) 10 MG tablet      Facility-Administered Medications Prior to Visit  Medication Dose Route Frequency Provider Last Rate Last Admin   0.9 %  sodium chloride infusion  500 mL Intravenous Once Armbruster, Carlota Raspberry, MD       Review of Systems  Review of Systems  Constitutional: Negative.   HENT: Negative.    Respiratory:  Negative for cough, chest tightness, shortness of breath and wheezing.      Physical Exam  BP (!) 112/58 (BP Location: Left Arm, Patient Position: Sitting, Cuff Size: Normal)   Pulse 78   Temp 98.2 F (36.8 C) (Oral)   Ht 5' (1.524 m)   Wt 158 lb (71.7 kg)   SpO2 95%   BMI 30.86 kg/m  Physical Exam Constitutional:      Appearance: Normal appearance.  HENT:     Head: Normocephalic and atraumatic.  Cardiovascular:     Rate and Rhythm: Normal rate.  Pulmonary:     Effort: Pulmonary effort  is normal.     Breath sounds: Normal breath sounds. No wheezing, rhonchi or rales.  Neurological:     General: No focal deficit present.     Mental Status: She is alert and oriented to person, place, and time. Mental status is at baseline.  Psychiatric:        Mood and Affect: Mood normal.        Behavior: Behavior normal.        Thought Content: Thought content normal.        Judgment: Judgment  normal.      Lab Results:  CBC    Component Value Date/Time   WBC 16.6 (H) 10/04/2014 0935   WBC 7.5 10/01/2014 0506   RBC 4.18 10/04/2014 0935   RBC 3.02 (L) 10/01/2014 0506   HGB 10.6 (L) 10/04/2014 0935   HCT 35.2 10/04/2014 0935   PLT 465 (H) 10/04/2014 0935   MCV 84 10/04/2014 0935   MCH 25.4 (L) 10/04/2014 0935   MCH 25.8 (L) 10/01/2014 0506   MCHC 30.1 (L) 10/04/2014 0935   MCHC 31.1 10/01/2014 0506   RDW 15.9 (H) 10/04/2014 0935   LYMPHSABS 3.2 (H) 10/04/2014 0935   MONOABS 0.4 09/26/2014 1530   EOSABS 0.1 10/04/2014 0935   BASOSABS 0.0 10/04/2014 0935    BMET    Component Value Date/Time   NA 138 10/01/2014 0506   K 3.4 (L) 10/01/2014 0506   CL 106 10/01/2014 0506   CO2 23 10/01/2014 0506   GLUCOSE 150 (H) 10/01/2014 0506   BUN 27 (H) 10/01/2014 0506   CREATININE 0.82 10/01/2014 0506   CALCIUM 8.0 (L) 10/01/2014 0506   GFRNONAA >60 10/01/2014 0506   GFRAA >60 10/01/2014 0506    BNP No results found for: "BNP"  ProBNP No results found for: "PROBNP"  Imaging: DG Chest 2 View  Result Date: 11/05/2021 CLINICAL DATA:  Follow-up from history prior COVID infection 05/2021. EXAM: CHEST - 2 VIEW COMPARISON:  Chest two views 09/12/2021, 06/13/2021, and 04/13/2017 FINDINGS: Cardiac silhouette and mediastinal contours within normal limits. Curvilinear scarring within the lateral left mid lung is unchanged from 04/13/2017. Mild bilateral mid and lower lung interstitial thickening is markedly improved from 07/15/2021. There may be minimally increased lower lung  interstitial thickening compared to remote 04/13/2017 radiographs, favored to represent interstitial scarring. Resolution of the peripheral inferior patchy pulmonary opacities seen on 06/13/2021 radiographs. No pleural effusion or pneumothorax. Mild multilevel degenerative disc changes of the thoracic spine. IMPRESSION: Resolution of the prior bilateral lower lung patchy airspace opacities and marked improvement in the prior bilateral interstitial thickening. No acute lung process. Electronically Signed   By: Yvonne Kendall M.D.   On: 11/05/2021 11:13     Assessment & Plan:   Multifocal pneumonia - Resolved; Admitted in January 2023 for covid/pneumonia. CXR today showed resolutoin prior bilateral lower lung opacities   Respiratory failure ([place]) - No daytime requirements. ONO in March 2023 showed patient spent 3 hours 51 min with SpO2 <88%. She remains on 2L at night. Plan repeat ONO on RA to see if we can discontinue oxygen all together. No overt signs of sleep apnea.   Vasculitis - Continue Rituxan per Dr. Amada Jupiter with rheumatology   FU in 4-6 months or sooner if needed   Martyn Ehrich, NP 11/05/2021

## 2021-11-05 NOTE — Assessment & Plan Note (Addendum)
-   Resolved; Admitted in January 2023 for covid/pneumonia. CXR today showed resolutoin prior bilateral lower lung opacities

## 2021-11-10 ENCOUNTER — Telehealth: Payer: Self-pay | Admitting: Primary Care

## 2021-11-10 NOTE — Telephone Encounter (Signed)
Called and spoke with patient. She stated that she had called [employer] earlier today and they stated that they did not receive the order. She received a call a few minutes ago from [facility] stating that they did have the order. She has been scheduled for the ONO tomorrow.   Nothing further needed at time of call.

## 2021-11-19 ENCOUNTER — Telehealth: Payer: Self-pay | Admitting: Internal Medicine

## 2021-11-19 NOTE — Telephone Encounter (Signed)
Called patient but she did not answer. Left message for her to call back tomorrow.  

## 2021-11-20 NOTE — Telephone Encounter (Signed)
ONO was ordered by Beth at last visit. Beth, please advise if you have the results of pt's ONO.

## 2021-11-20 NOTE — Telephone Encounter (Signed)
Called and spoke with patient, she was asking for the results of her ONO that was recently done by [employer].  She states that she was left a message that we had the results.  I advised her that I did not see any results in her chart and did not see the faxed report in Dr. Golden Pop files.  She states she called [employer] yesterday and was told they had faxed the results to [place].  I let her know I would call [employer] to request the results and once we have them and they have been interpreted by Dr. Chase Caller one of [place] will call her back.  She verbalized understanding.  Called and spoke with Lattie Haw at [facility], she stated she would resend the results.  Results received from [facility], will route to Dr. Chase Caller and Raquel Sarna for follow up.  Dr. Chase Caller, Patient is calling regarding results of ONO, results placed on desk for review.  Please advise.  Thank you.

## 2021-11-20 NOTE — Telephone Encounter (Signed)
Called and did not get answer- LMTCB.

## 2021-11-20 NOTE — Telephone Encounter (Signed)
Apologies for the delay.  Her pulse ox overnight done on 11/11/2021 shows pulse ox less than or equal to 88% for 2 hours and 34 minutes and 15 seconds.  She also had some amount of bradycardia with the lowest pulse of 55 bpm at 28% of sleep time with this is not too low  Plan - Start 2 L of nasal cannula oxygen at night

## 2021-11-21 NOTE — Telephone Encounter (Signed)
Called and spoke with pt letting her know the results of the ONO. Stated to pt that the ONO results were slightly better than the ONO that was performed 3/25 as with that ONO, she spent less than or equal to 88% for 3 hours and 51 minutes and this time she spent less than or equal to 88% for 2 hours and 34 minutes.   Pt verbalized understanding. Pt already has O2 from prior ONO  performed and was told that she needs to make sure she wears O2 at 2L at night. Nothing further needed.

## 2022-07-30 IMAGING — DX DG CHEST 2V
2 series · 2 of 2 positions shown · non-contrast
Comparison: Chest two views 09/12/2021, 06/13/2021, and 04/13/2017

CLINICAL DATA: Follow-up from history prior COVID infection
[DATE].

EXAM:
CHEST - 2 VIEW

[chest pa]
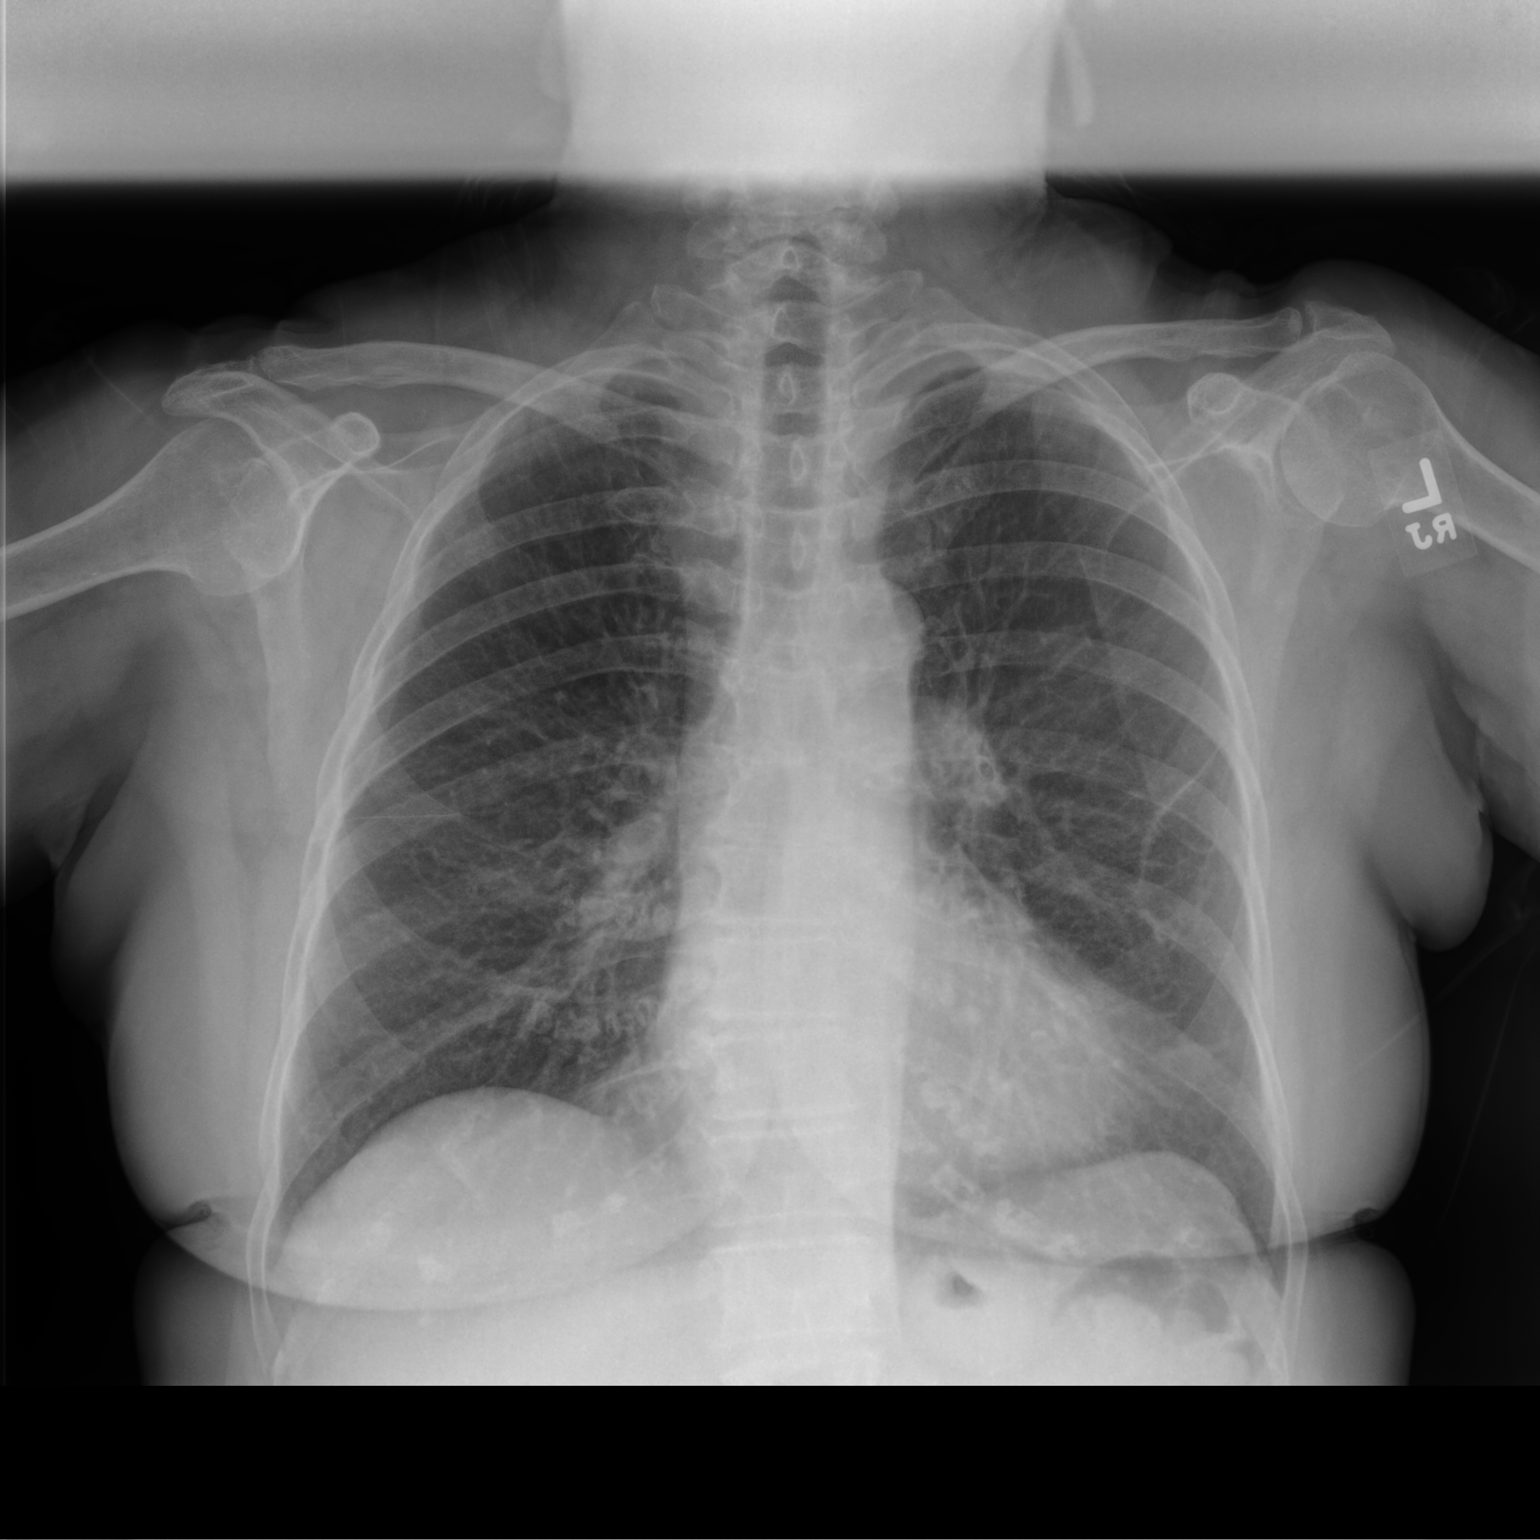

[chest lat]
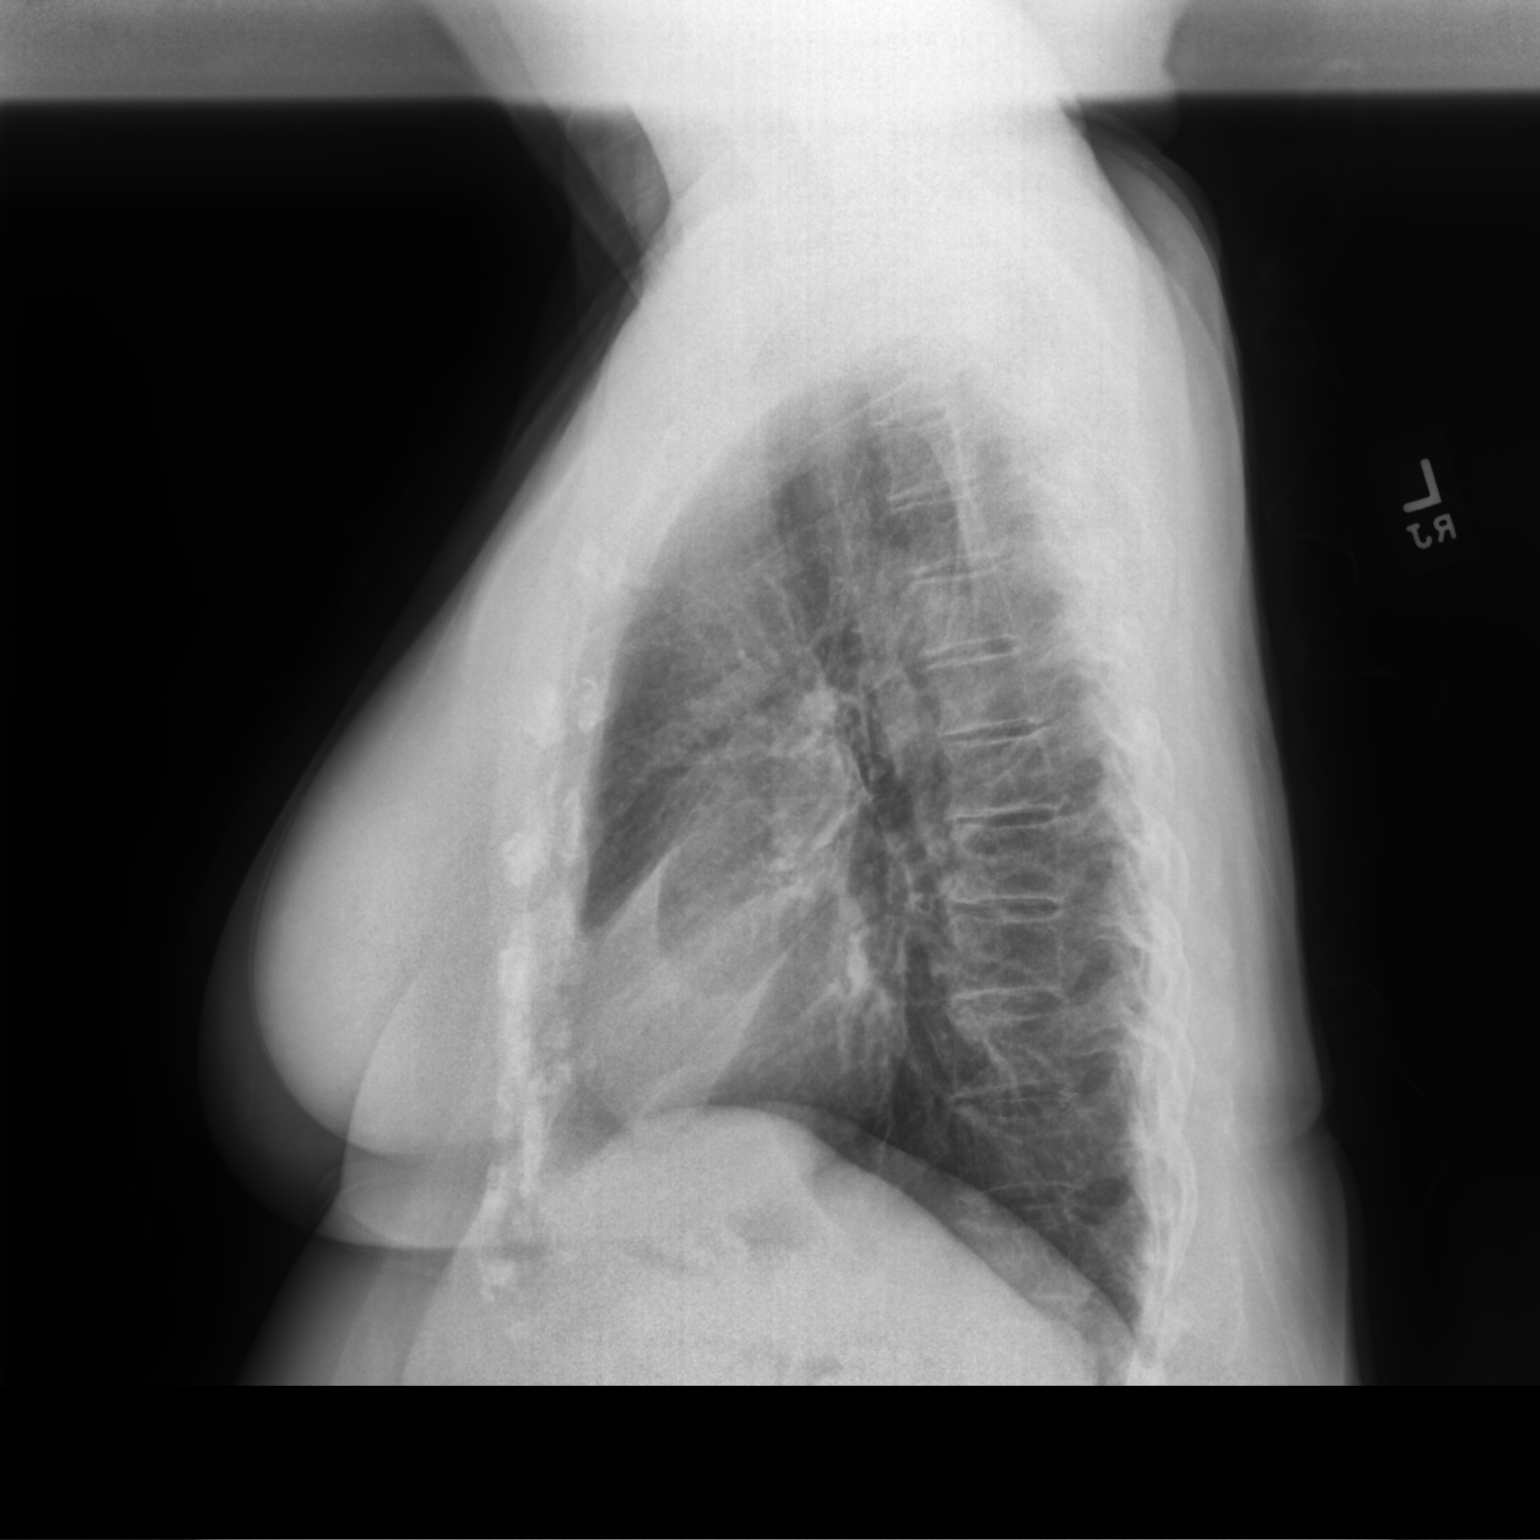

[2 of 2 positions shown; findings below may reference images not displayed]

FINDINGS: Cardiac silhouette and mediastinal contours within normal limits.
Curvilinear scarring within the lateral left mid lung is unchanged
from 04/13/2017. Mild bilateral mid and lower lung interstitial
thickening is markedly improved from 07/15/2021. There may be
minimally increased lower lung interstitial thickening compared to
remote 04/13/2017 radiographs, favored to represent interstitial
scarring. Resolution of the peripheral inferior patchy pulmonary
opacities seen on 06/13/2021 radiographs.

No pleural effusion or pneumothorax. Mild multilevel degenerative
disc changes of the thoracic spine.
IMPRESSION: Resolution of the prior bilateral lower lung patchy airspace
opacities and marked improvement in the prior bilateral interstitial
thickening. No acute lung process.

## 2023-01-26 ENCOUNTER — Telehealth: Payer: Self-pay | Admitting: Internal Medicine

## 2023-01-26 DIAGNOSIS — J9601 Acute respiratory failure with hypoxia: Secondary | ICD-10-CM

## 2023-01-26 NOTE — Telephone Encounter (Signed)
PT made FU appt w/Dr. Marchelle Gearing. Her records looks as though she does regular ONO tests. She is calling to request one but no order is put in yet. Sending to Triage to put order in. Please then have the PCC's call PT to advise date and time.

## 2023-01-29 NOTE — Telephone Encounter (Signed)
Order for Pulse oximetry, overnight placed. Nothing further needed.

## 2023-02-05 ENCOUNTER — Telehealth: Payer: Self-pay | Admitting: Internal Medicine

## 2023-02-05 NOTE — Telephone Encounter (Signed)
Patient aware [employer] will be calling her to set it up

## 2023-02-05 NOTE — Telephone Encounter (Signed)
Patient is following up on previous encounter. She is in need of a finger piece that takes oxygen levels. It should be coming from [facility] but she has not heard anything yet. Please call and advise [phone]

## 2023-02-05 NOTE — Telephone Encounter (Signed)
I spoke with Monisha with [employer] and she confirmed they have the ONO order but the person that handles those will not be in the office until Monday 02/08/23

## 2023-03-22 ENCOUNTER — Encounter: Payer: Self-pay | Admitting: Internal Medicine

## 2023-03-22 ENCOUNTER — Ambulatory Visit: Payer: Medicare Other | Admitting: Internal Medicine

## 2023-03-22 VITALS — BP 122/68 | HR 71 | Ht 60.0 in | Wt 154.0 lb

## 2023-03-22 DIAGNOSIS — J9601 Acute respiratory failure with hypoxia: Secondary | ICD-10-CM | POA: Diagnosis not present

## 2023-03-22 NOTE — Patient Instructions (Addendum)
History of acute respiratory failure History of 2019 novel coronavirus disease (COVID-19) - jan 2023 Immunosuppressed status (HCC) with Rituxan due to vasculitis  - glad you are so much better and no shortness of breath  - as of July 2023 - still needing night o2  - On visit 03/22/2023 - feeling well and wanting to stop night o2   Plan - check ONO on room air  - if normal then we can discontinue oxygen - order by cMA 03/22/2023 to [employer] - explain patient preference for wrist strap pulse ox  - if abnormal, will need HRCT  - talk to Dr Nickola Major to see if you need Bactrim prophylaxis in setting of Rituxan  Followup - send a mychart message a week after ono done  - 6 months with NP and if doing well discharge from followup
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
## 2023-09-20 ENCOUNTER — Telehealth: Payer: Self-pay | Admitting: *Deleted

## 2023-09-20 ENCOUNTER — Ambulatory Visit: Payer: Medicare Other | Admitting: Adult Health

## 2023-09-20 NOTE — Telephone Encounter (Signed)
 Called [employer] and was told that it was sent out in November, however she did not see any results in the system.  There is no ticket that it has been picked up.  Advised I would call the patient.  I called and spoke with the patient, she states she has done it twice and twice it has been picked up.  Advised I would look for the results as it would be helpful for her appointment tomorrow. I called [employer] at [phone] to request a copy of the ONO to be faxed to [phone].  Will await fax.

## 2023-09-21 ENCOUNTER — Ambulatory Visit (INDEPENDENT_AMBULATORY_CARE_PROVIDER_SITE_OTHER): Payer: Medicare Other | Admitting: Adult Health

## 2023-09-21 ENCOUNTER — Ambulatory Visit (INDEPENDENT_AMBULATORY_CARE_PROVIDER_SITE_OTHER)

## 2023-09-21 ENCOUNTER — Encounter: Payer: Self-pay | Admitting: Adult Health

## 2023-09-21 VITALS — BP 120/62 | HR 75 | Temp 98.5°F | Ht 60.0 in | Wt 150.0 lb

## 2023-09-21 DIAGNOSIS — J189 Pneumonia, unspecified organism: Secondary | ICD-10-CM

## 2023-09-21 DIAGNOSIS — I7782 Antineutrophilic cytoplasmic antibody (ANCA) vasculitis: Secondary | ICD-10-CM | POA: Diagnosis not present

## 2023-09-21 DIAGNOSIS — J9611 Chronic respiratory failure with hypoxia: Secondary | ICD-10-CM | POA: Diagnosis not present

## 2023-09-21 DIAGNOSIS — Z8616 Personal history of COVID-19: Secondary | ICD-10-CM | POA: Diagnosis not present

## 2023-09-21 DIAGNOSIS — R0902 Hypoxemia: Secondary | ICD-10-CM

## 2023-09-21 DIAGNOSIS — R0683 Snoring: Secondary | ICD-10-CM

## 2023-09-21 DIAGNOSIS — I776 Arteritis, unspecified: Secondary | ICD-10-CM

## 2023-09-21 NOTE — Telephone Encounter (Signed)
 Called [employer] again this am and requested that the ONO be sent to the office, requested that it be sent to my work e-mail address.  I have still not received the fax from yesterday or the e-mail from this morning at 8:30 am.  I called again at 10:11 am to request the ONO report. Spoke with Trevor Fudge and explained the issue with trying to obtain the ONO report for the patient for her appointment.  I was told it had been sent by fax and by e-mail.  She stated she would re e-mail it while I was on the line to ensure I receive it.  While on the phone, I received the ONO by e-mail and fax.  Nothing further needed.  Report given to Tammy for review for the afternoon visit.

## 2023-09-21 NOTE — Patient Instructions (Signed)
 Continue on Oxygen 2l/m At bedtime   Set up for split night sleep study  Activity as tolerated  Chest xray today  Follow up in 6-8 weeks and As needed

## 2023-09-21 NOTE — Progress Notes (Unsigned)
 @Patient  ID: Kristie Porter, female    DOB: [birth year], 78 y.o.   MRN: [id number]  Chief Complaint  Patient presents with   Follow-up    Referring provider: Azalia Leo, MD  HPI: 78 year old female never smoker followed for  chronic respiratory failure-on nocturnal oxygen. 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  TEST/EVENTS :  Hospitalization January 2023 for acute respiratory failure secondary to COVID-19 complicated by multifocal pneumonia  Chest x-ray June 2023 resolved bilateral lower lung patchy airspace opacities with marked improvement in bilateral interstitial thickening.   09/21/2023 Follow up : Chronic respiratory failure on nocturnal oxygen Discussed the use of AI scribe software for clinical note transcription with the patient, who gave verbal consent to proceed.  History of Present Illness   Kristie Porter is a 78 year old female with ANCA vasculitis who presents for a pulmonary checkup due to ongoing nighttime oxygen use following COVID-19 pneumonia in January 2023. She is accompanied by her spouse.  She has been using oxygen at night since recovering from severe COVID-19 pneumonia in January 2023. She was hospitalized for this condition and discharged with home oxygen therapy. Despite recovery, she continues to require nighttime oxygen.  Overnight oximetry test in January 2025 showed desaturations on room air.  No daytime breathing issues, shortness of breath, or wheezing are reported. Denies Cough. Spouse says she does snore loudly. Has daytime sleepiness and wakes up tired. Sleep is restless at times. She is a never smoker with no history of asthma. No previous PFTs. Chest xray 10/2021 showed resolved bilateral patchy opacities and marked improvement in bilateral interstitial thickening.   She has a history of ANCA vasculitis, for which she sees a rheumatologist and receives  Rituxan infusions twice a year. She also takes gabapentin , originally prescribed for pain related to her vasculitis, although she is unsure of the current necessity of this medication.  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 She has six dogs. Does have chickens outside in coop but not interact with the chickens.  In the review of symptoms, she denies any cough, wheezing, or daytime shortness of breath. She reports occasional tiredness but remains active and does not nap frequently.      Allergies  Allergen Reactions   Oxymorphone     Other reaction(s): Dizziness    Immunization History  Administered Date(s) Administered   Influenza, Seasonal, Injecte, Preservative Fre 02/15/2014   Influenza-Unspecified 02/15/2014, 03/31/2021, 02/16/2023   PFIZER(Purple Top)SARS-COV-2 Vaccination 06/15/2019, 07/11/2019, 02/16/2020    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 "   Chronic kidney disease    II   Decreased ambulation status 09/2014   ' went from ambulating with a cane to being unable to ambulate   Depression    Family history of adverse reaction to anesthesia    difficult to put my mother to sleep "   Hypercholesteremia    Hyperlipidemia    past hx   Hyperplastic colon polyp    Hypertension    past hx   Numbness and tingling of both legs    Peripheral neuropathy, 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     albuterol (VENTOLIN HFA) 108 (90 Base) MCG/ACT inhaler Inhale 2 puffs  into the lungs every 6 (six) hours as needed.     Ascorbic Acid (VITAMIN C) 100 MG tablet Take 100 mg by mouth daily.     escitalopram (LEXAPRO) 10 MG tablet      gabapentin  (NEURONTIN ) 300 MG capsule Take 300 mg by mouth 3 (three) times daily.     GEMTESA 75 MG TABS Take 1 tablet by mouth daily.     letrozole (FEMARA) 2.5 MG tablet Take 2.5 mg by mouth daily.     Menaquinone-7 (VITAMIN K2) 100 MCG CAPS Take 1 tablet by mouth daily.     Multiple Vitamin (MULTIVITAMIN) capsule Take 1 capsule by mouth daily.     pramipexole (MIRAPEX) 0.125 MG tablet      riTUXimab in sodium chloride  0.9 % 250 mL Inject into the vein once. every 6 months     rosuvastatin (CRESTOR) 10 MG tablet      TURMERIC PO Take 1 tablet by mouth daily.     Facility-Administered Medications Prior to Visit  Medication Dose Route Frequency Provider Last Rate Last Admin   0.9 %  sodium chloride  infusion  500 mL Intravenous Once Armbruster, Lendon Queen, MD         Review of Systems:   Constitutional:   No  weight loss, night sweats,  Fevers, chills, +fatigue, or  lassitude.  HEENT:   No headaches,  Difficulty swallowing,  Tooth/dental problems, or  Sore throat,                No sneezing, itching, ear ache, nasal congestion, post nasal drip,   CV:  No chest pain,  Orthopnea, PND, swelling in lower extremities, anasarca, dizziness, palpitations, syncope.   GI  No heartburn, indigestion, abdominal pain, nausea, vomiting, diarrhea, change in bowel habits, loss of appetite, bloody stools.   Resp: No shortness of breath with exertion or at rest.  No excess mucus, no productive cough,  No non-productive cough,  No coughing up of blood.  No change in color of mucus.  No wheezing.  No chest wall deformity  Skin: no rash or lesions.  GU: no dysuria, change in color of urine, no urgency or frequency.  No flank pain, no hematuria   MS:  No joint pain or swelling.  No decreased range of motion.  No back pain.    Physical  Exam  BP 120/62 (BP Location: Left Arm, Patient Position: Sitting, Cuff Size: Normal)   Pulse 75   Temp 98.5 F (36.9 C) (Oral)   Ht 5' (1.524 m)   Wt 150 lb (68 kg)   SpO2 94%   BMI 29.29 kg/m   GEN: A/Ox3; pleasant , NAD, well nourished    HEENT:  [place]/AT, NOSE-clear, THROAT-clear, no lesions, no postnasal drip or exudate noted.   NECK:  Supple w/ fair ROM; no JVD; normal carotid impulses w/o bruits; no thyromegaly or nodules palpated; no lymphadenopathy.    RESP  Clear  P & A; w/o, wheezes/ rales/ or rhonchi. no accessory muscle use, no dullness to percussion  CARD:  RRR, no m/r/g, no peripheral edema, pulses intact, no cyanosis or clubbing.  GI:   Soft & nt; nml bowel sounds; no  organomegaly or masses detected.   Musco: Warm bil, no deformities or joint swelling noted.   Neuro: alert, no focal deficits noted.    Skin: Warm, no lesions or rashes    Lab Results:     BNP No results found for: "BNP"  ProBNP No results found for: "PROBNP"  Imaging: No results found.  Administration History     None           No data to display          Lab Results  Component Value Date   NITRICOXIDE 24 12/26/2020        Assessment & Plan:     Assessment and Plan    History of COVID-19 with pneumonia  - Severe COVID-19 infection in January 2023 resulted in pneumonia and hypoxemia, necessitating home oxygen therapy at discharge. She is a never smoker with no know history of lung disease prior to admission. She experiences persistent nocturnal hypoxemia. Possible causes include residual lung scarring from COVID-19 pneumonia, undiagnosed sleep apnea, or ANCA-associated vasculitis affecting the lungs. A sleep study is needed to evaluate for sleep apnea, and chest imaging will assess for lung scarring or other abnormalities. Order a chest x-ray and conduct a sleep study. Continue nighttime oxygen therapy at 2 liters. Pending results of chest xray decide if HRCT chest  is indicated.   Hypoxemia   She has persistent nocturnal hypoxemia with significant drops in oxygen saturation. Potential causes include residual effects of COVID-19 pneumonia, undiagnosed sleep apnea, or lung involvement from ANCA-associated vasculitis. A sleep study and chest imaging are planned to further evaluate the cause. Order a chest x-ray and conduct a sleep study. Continue nighttime oxygen therapy at 2 liters.  ANCA-associated vasculitis   ANCA-associated vasculitis is managed with Rituxan. No current symptoms suggest active vasculitis affecting the lungs, but it remains a potential contributor to hypoxemia if lung involvement is present. Continue Rituxan therapy as prescribed and follow up with Rheumatology .       Roena Clark, NP 09/21/2023

## 2023-09-24 NOTE — Progress Notes (Signed)
 Pt notified on identifiable VM

## 2023-10-05 ENCOUNTER — Ambulatory Visit: Payer: Self-pay | Admitting: Adult Health

## 2023-10-05 NOTE — Progress Notes (Signed)
 Message sent to [facility] for update on status of HRCT being scheduled.

## 2023-10-13 ENCOUNTER — Ambulatory Visit (HOSPITAL_BASED_OUTPATIENT_CLINIC_OR_DEPARTMENT_OTHER)
Admission: RE | Admit: 2023-10-13 | Discharge: 2023-10-13 | Disposition: A | Discharge status: Home / Self Care | Source: Ambulatory Visit | Attending: Adult Health | Admitting: Adult Health

## 2023-10-13 DIAGNOSIS — J9611 Chronic respiratory failure with hypoxia: Secondary | ICD-10-CM | POA: Diagnosis present

## 2023-10-13 DIAGNOSIS — J189 Pneumonia, unspecified organism: Secondary | ICD-10-CM | POA: Insufficient documentation

## 2023-10-13 DIAGNOSIS — I776 Arteritis, unspecified: Secondary | ICD-10-CM | POA: Insufficient documentation

## 2023-10-19 ENCOUNTER — Ambulatory Visit: Payer: Self-pay | Admitting: Adult Health

## 2023-10-28 ENCOUNTER — Encounter (HOSPITAL_BASED_OUTPATIENT_CLINIC_OR_DEPARTMENT_OTHER): Payer: Self-pay | Admitting: Adult Health

## 2023-10-28 ENCOUNTER — Ambulatory Visit (HOSPITAL_BASED_OUTPATIENT_CLINIC_OR_DEPARTMENT_OTHER): Admitting: Adult Health

## 2023-10-28 VITALS — BP 145/68 | HR 82 | Ht 60.0 in | Wt 150.0 lb

## 2023-10-28 DIAGNOSIS — I776 Arteritis, unspecified: Secondary | ICD-10-CM | POA: Diagnosis not present

## 2023-10-28 DIAGNOSIS — J849 Interstitial pulmonary disease, unspecified: Secondary | ICD-10-CM | POA: Diagnosis not present

## 2023-10-28 DIAGNOSIS — J9611 Chronic respiratory failure with hypoxia: Secondary | ICD-10-CM | POA: Diagnosis not present

## 2023-10-28 NOTE — Progress Notes (Signed)
 @Patient  ID: Kristie Porter, female    DOB: 1945-09-03, 78 y.o.   MRN: [id number]  No chief complaint on file.   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   10/28/2023 Follow up: Nocturnal hypoxemia, O2 RF  Patient returns for a 1 month follow-up.  Patient was seen last visit for 38-month follow-up.  She has nocturnal hypoxemia and is on oxygen 2 L at bedtime.  She has a previous history of severe COVID-19 pneumonia requiring hospitalization in January 2023.  She was discharged on oxygen.  She was able to wean off of daytime oxygen but has continued to require ongoing nighttime oxygen.  She had an overnight oximetry test January 2025 that showed ongoing desaturations on room air.  She has no significant cough wheezing or dyspnea.  Chest x-ray in 2023 showed resolved bilateral patchy opacities.  And marked improvement in the bilateral interstitial thickening.  Patient has an underlying ANCA vasculitis is followed by rheumatology.  She is on Rituxan infusion every 6 months. Had Rituxan infusion today.   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  Has 6 dogs.  She does have a chicken coop outside but does not interact with the chickens.  She has no basement or hot tub.    Last visit was recommended on an in-lab sleep study to rule out sleep apnea.  Chest x-ray showed chronic interstitial prominence at the lung bases.  She was set up for a high-resolution CT chest.  This showed a bibasilar  coarse and subpleural groundglass densities and traction bronchiolectasis.  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  She denies any significant shortness of breath.  No cough.  We discussed her CT results in detail.  She has no hemoptysis or unintentional weight loss.  No joint swelling, rash.     Allergies  Allergen Reactions   Oxymorphone     Other reaction(s): Dizziness    Immunization History  Administered Date(s) Administered   Influenza, Seasonal, Injecte, Preservative Fre 02/15/2014   Influenza-Unspecified 02/15/2014, 03/31/2021, 02/16/2023   PFIZER(Purple Top)SARS-COV-2 Vaccination 06/15/2019, 07/11/2019, 02/16/2020    Past Medical History:  Diagnosis Date   Abnormality of gait 10/04/2014   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    Chronic kidney disease    II   Decreased ambulation status 09/2014   ' went from ambulating with a cane to being unable to ambulate   Depression    Family history of adverse reaction to anesthesia    difficult to put my mother to sleep    Hypercholesteremia    Hyperlipidemia    past hx   Hyperplastic colon polyp    Hypertension    past hx   Numbness and tingling of both legs    Peripheral neuropathy, 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     albuterol (VENTOLIN HFA) 108 (90 Base) MCG/ACT inhaler Inhale 2 puffs into the lungs every 6 (six) hours as needed.     Ascorbic Acid (VITAMIN C) 100 MG tablet Take 100 mg by mouth daily.     escitalopram (LEXAPRO) 10 MG tablet      gabapentin  (NEURONTIN ) 300 MG capsule Take 300 mg by  mouth 3 (three) times daily.     GEMTESA 75 MG TABS Take 1 tablet by mouth daily.     letrozole (FEMARA) 2.5 MG tablet Take 2.5 mg by mouth daily.     Menaquinone-7 (VITAMIN K2) 100 MCG CAPS Take 1 tablet by mouth daily.     Multiple Vitamin (MULTIVITAMIN) capsule Take 1 capsule by mouth daily.     pramipexole (MIRAPEX) 0.125 MG tablet      riTUXimab in sodium chloride  0.9 % 250 mL Inject into the vein once. every 6 months     rosuvastatin (CRESTOR) 10 MG tablet      TURMERIC PO Take 1 tablet by mouth daily.     Facility-Administered Medications Prior to Visit  Medication Dose Route Frequency Provider Last Rate Last Admin   0.9 %  sodium chloride  infusion  500 mL Intravenous Once Armbruster, Lendon Queen, MD         Review of Systems:   Constitutional:   No  weight loss, night sweats,  Fevers, chills, +fatigue, or  lassitude.  HEENT:   No headaches,  Difficulty swallowing,  Tooth/dental problems, or  Sore throat,                No sneezing, itching, ear ache, nasal congestion, post nasal drip,   CV:  No chest pain,  Orthopnea, PND, swelling in lower extremities, anasarca, dizziness, palpitations, syncope.   GI  No heartburn, indigestion, abdominal pain, nausea, vomiting, diarrhea, change in bowel habits, loss of appetite, bloody stools.   Resp: No shortness of breath with exertion or at rest.  No excess mucus, no productive cough,  No non-productive cough,  No coughing up of blood.  No change in color of mucus.  No wheezing.  No chest wall deformity  Skin: no rash or lesions.  GU: no dysuria, change in color of urine, no urgency or frequency.  No flank pain, no hematuria   MS:  No joint pain or swelling.  No decreased range of motion.  No back pain.    Physical Exam    GEN: A/Ox3; pleasant , NAD, well nourished    HEENT:  [place]/AT,   NOSE-clear, THROAT-clear, no lesions, no postnasal drip or exudate noted.   NECK:  Supple w/ fair ROM; no JVD; normal carotid impulses w/o bruits;  no thyromegaly or nodules palpated; no lymphadenopathy.    RESP  Clear  P & A; w/o, wheezes/ rales/ or rhonchi. no accessory muscle use, no dullness to percussion  CARD:  RRR, no m/r/g, no peripheral edema, pulses intact, no cyanosis or clubbing.  GI:   Soft & nt; nml bowel sounds; no organomegaly or masses detected.   Musco: Warm bil, no deformities or joint swelling noted.   Neuro: alert, no focal deficits noted.    Skin: Warm, no lesions or rashes    Lab Results:     BNP No results found for: BNP  ProBNP No results found for: PROBNP  Imaging: CT CHEST  HIGH RESOLUTION Result Date: 10/18/2023 CLINICAL DATA:  Interstitial lung disease. Chronic respiratory failure with hypoxia. EXAM: CT CHEST WITHOUT CONTRAST TECHNIQUE: Multidetector CT imaging of the chest was performed following the standard protocol without intravenous contrast. High resolution imaging of the lungs, as well as inspiratory and expiratory imaging, was performed. RADIATION DOSE REDUCTION: This exam was performed according to the departmental dose-optimization program which includes automated exposure control, adjustment of the mA and/or kV according to patient size and/or use of iterative reconstruction technique. COMPARISON:  None. FINDINGS: Cardiovascular: Atherosclerotic calcification of the aorta. Heart is at the upper limits of normal in size to mildly enlarged. No pericardial effusion. Mediastinum/Nodes: No pathologically enlarged mediastinal or axillary lymph nodes. Hilar regions are difficult to definitively evaluate without IV contrast. Lobectomy scarring in the medial right breast. Esophagus is grossly unremarkable. Lungs/Pleura: Basilar predominant coarsened subpleural ground-glass, subpleural reticular densities and traction bronchiolectasis. Scarring in the lingula. No pleural fluid. Airway is unremarkable. There is air trapping. Upper Abdomen: Visualized portions of the liver, gallbladder, adrenal glands,  kidneys, spleen, pancreas, stomach and bowel are grossly unremarkable. No upper abdominal adenopathy. Musculoskeletal: Degenerative changes in the spine. IMPRESSION: 1. Pulmonary parenchymal pattern of interstitial lung disease, as detailed above. Findings are categorized as probable UIP per consensus guidelines: Diagnosis of Idiopathic Pulmonary Fibrosis: An Official ATS/ERS/JRS/ALAT Clinical Practice Guideline. Am Annie Barton Crit Care Med Vol 198, Iss 5, [phone], Jan 16 2017. 2. Air trapping is indicative a component of small airways disease. 3.  Aortic atherosclerosis (ICD10-I70.0). Electronically Signed   By: Shearon Denis M.D.   On: 10/18/2023 13:03    Administration History     None           No data to display          Lab Results  Component Value Date   NITRICOXIDE 24 12/26/2020        Assessment & Plan:   No problem-specific Assessment & Plan notes found for this encounter.     Roena Clark, NP 10/28/2023

## 2023-10-28 NOTE — Patient Instructions (Addendum)
 Continue on Oxygen 2l/m At bedtime   Split night sleep study as planned  Activity as tolerated  Follow up with Dr. Bertrum Brodie in 3 months -ILD slot with PFT

## 2023-10-29 DIAGNOSIS — J849 Interstitial pulmonary disease, unspecified: Secondary | ICD-10-CM | POA: Insufficient documentation

## 2023-10-29 NOTE — Assessment & Plan Note (Signed)
 Nocturnal hypoxemia-high-resolution CT chest shows interstitial lung disease which could be contributing to her ongoing nocturnal desaturations.  Continue on oxygen 2 L to maintain O2 saturations greater than 88 to 90%.  In-lab sleep study is pending to rule out underlying sleep apnea.

## 2023-10-29 NOTE — Assessment & Plan Note (Signed)
 ANCA related vasculitis followed by rheumatology.  Continue on Rituxan, and follow-up with rheumatology

## 2023-10-29 NOTE — Assessment & Plan Note (Addendum)
 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  Patient has underlying ANCA vasculitis-as this could be connective tissue related interstitial lung disease.  She also had a severe critical illness in January 2023 with multifocal pneumonia in the setting of COVID-19 infection.  She has no significant symptom burden but is somewhat sedentary.  No significant dyspnea or cough.  Will set up pulmonary function testing to be completed on return visit.  Can decide moving forward if any additional autoimmune/connective tissue disease serology is indicated as she has had an extensive workup in the past with rheumatology  Plan  Patient Instructions  Continue on Oxygen 2l/m At bedtime   Split night sleep study as planned  Activity as tolerated  Follow up with Dr. Bertrum Brodie in 3 months -ILD slot with PFT

## 2023-11-28 ENCOUNTER — Ambulatory Visit (HOSPITAL_BASED_OUTPATIENT_CLINIC_OR_DEPARTMENT_OTHER): Admitting: Pulmonary Disease

## 2023-12-24 ENCOUNTER — Ambulatory Visit (HOSPITAL_BASED_OUTPATIENT_CLINIC_OR_DEPARTMENT_OTHER): Admitting: Adult Health

## 2024-01-04 ENCOUNTER — Ambulatory Visit (HOSPITAL_BASED_OUTPATIENT_CLINIC_OR_DEPARTMENT_OTHER): Attending: Adult Health | Admitting: Pulmonary Disease

## 2024-01-04 VITALS — Ht 60.0 in | Wt 154.0 lb

## 2024-01-04 DIAGNOSIS — J9611 Chronic respiratory failure with hypoxia: Secondary | ICD-10-CM

## 2024-01-04 DIAGNOSIS — G4733 Obstructive sleep apnea (adult) (pediatric): Secondary | ICD-10-CM

## 2024-01-04 DIAGNOSIS — R0683 Snoring: Secondary | ICD-10-CM

## 2024-01-09 NOTE — Procedures (Signed)
 [facility] [address] [place], [place] [address] Tel: [phone]   Fax: [phone]  Polysomnography Interpretation  Patient Name:  AIRYONNA, FRANKLYN Date:  01/04/2024 Referring Physician:  MADELIN PARRETT [phone]) %%startinterp%% Indications for Polysomnography The patient is a 78 year old Female who is 5' and weighs 154.0 lbs. Her BMI equals 30.2.  A full night polysomnogram was performed to evaluate for persistent nocturnal hypoxemia with significant drops in oxygen saturation. Potential causes include residual effects of COVID-19 pneumonia, undiagnosed sleep apnea, or lung involvement from ANCA-associated vasculitis.  .  Polysomnogram Data A full night polysomnogram recorded the standard physiologic parameters including EEG, EOG, EMG, EKG, nasal and oral airflow.  Respiratory parameters of chest and abdominal movements were recorded with Respiratory Inductance Plethysmography belts.  Oxygen saturation was recorded by pulse oximetry.   Sleep Architecture The total recording time of the polysomnogram was 451.2 minutes.  The total sleep time was 389.5 minutes.  The patient spent 9.2% of total sleep time in Stage N1, 76.3% in Stage N2, 0.0% in Stages N3, and 14.5% in REM.  Sleep latency was 18.4 minutes.  REM latency was 286.5 minutes.  Sleep Efficiency was 86.3%.  Wake after Sleep Onset time was 43.0 minutes.  Respiratory Events The polysomnogram revealed a presence of 4 obstructive, 3 centrals, and - mixed apneas resulting in an Apnea index of 1.1 events per hour.  There were 80 hypopneas (>=3% desaturation and/or arousal) resulting in an Apnea\Hypopnea Index (AHI >=3% desaturation and/or arousal) of 13.4 events per hour.  There were 54 hypopneas (>=4% desaturation) resulting in an Apnea\Hypopnea Index (AHI >=4% desaturation) of 9.4 events per hour.  There were 19 Respiratory Effort Related Arousals resulting in a RERA index of 2.9 events per hour. The  Respiratory Disturbance Index is 16.3 events per hour.  The snore index was 235.1 events per hour.  Mean oxygen saturation was 89.7%.  The lowest oxygen saturation during sleep was 79.0%.  Time spent <=88% oxygen saturation was 86.2 minutes (19.4%).  Limb Activity There were - total limb movements recorded, of this total, - were classified as PLMs.  PLM index was - per hour and PLM associated with Arousals index was - per hour.  Cardiac Summary The average pulse rate was 69.4 bpm.  The minimum pulse rate was 61.0 bpm while the maximum pulse rate was 92.0 bpm.  Cardiac rhythm was normal/abnormal.  Comments:   Diagnosis: Mild OSA predominant hypopneas .Events were worse in the supine position Nocturnal hypoxia , related to underlying cardiopulmonary disease  Recommendations: Options include oral appliance or CPAP therapy Consider nocturnal oxygen   This study was personally reviewed and electronically signed by: Dr. Harden Staff Accredited Board Certified in Sleep Medicine

## 2024-01-24 NOTE — Progress Notes (Signed)
Patient already scheduled.  Nothing further needed.

## 2024-02-14 ENCOUNTER — Ambulatory Visit (INDEPENDENT_AMBULATORY_CARE_PROVIDER_SITE_OTHER): Admitting: Adult Health

## 2024-02-14 ENCOUNTER — Encounter: Payer: Self-pay | Admitting: Adult Health

## 2024-02-14 VITALS — BP 122/67 | HR 74 | Temp 98.5°F | Ht 60.0 in | Wt 160.0 lb

## 2024-02-14 DIAGNOSIS — G4736 Sleep related hypoventilation in conditions classified elsewhere: Secondary | ICD-10-CM

## 2024-02-14 DIAGNOSIS — R0602 Shortness of breath: Secondary | ICD-10-CM

## 2024-02-14 DIAGNOSIS — J9611 Chronic respiratory failure with hypoxia: Secondary | ICD-10-CM | POA: Diagnosis not present

## 2024-02-14 DIAGNOSIS — J849 Interstitial pulmonary disease, unspecified: Secondary | ICD-10-CM | POA: Diagnosis not present

## 2024-02-14 DIAGNOSIS — G4733 Obstructive sleep apnea (adult) (pediatric): Secondary | ICD-10-CM | POA: Diagnosis not present

## 2024-02-14 NOTE — Patient Instructions (Addendum)
 Continue on Oxygen 2l/m At bedtime   Elevate head of bed at 30 degree  Work on healthy weight loss.  Activity as tolerated  Set up for Echo  Follow up with Dr. Geronimo or Sible Straley NP in 3 months -ILD slot with PFT

## 2024-02-14 NOTE — Progress Notes (Signed)
 @Patient  ID: Kristie Porter, female    DOB: 05/04/1946, 78 y.o.   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 Has 6 dogs. She does have a chicken coop outside but does not interact with the chickens. She has no basement or hot tub.    TEST/EVENTS :  Hospitalization January 2023 for acute respiratory failure secondary to COVID-19 complicated by multifocal pneumonia   Chest x-ray June 2023 resolved bilateral lower lung patchy airspace opacities with marked improvement in bilateral interstitial thickening.  High-resolution CT chest showed bibasilar coarse and subpleural ground glass densities and traction bronchiolectasis, scarring in the lingula findings are categorized as probable UIP  NPSG January 04, 2024 showed mild sleep apnea with an AHI at 9.4 hours SpO2 low at 79%, time spent less than 88% O2 saturations was 86 minutes (19% of the study)   Discussed the use of AI scribe software for clinical note transcription with the patient, who gave verbal consent to proceed.  History of Present Illness Kristie Bresee is a 78 year old female who presents for follow-up regarding her sleep study results.   She underwent a sleep study completed January 04, 2024 which revealed mild sleep apnea, AHI 9.4/hour and SpO2 low at 79%  She experiences tiredness, low energy, and restless sleep. She uses oxygen at bedtime at 2l/m .  We discussed her sleep study results in detail.  Went over treatment options for sleep apnea including  weight loss, dental appliance, positional sleep and CPAP therapy.  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 She has a history of severe COVID-19 pneumonia in 2023.  She also has a history of vasculitis on rituxan every 6 months.  Her current activity level is low;SABRA She experiences coughing spells , predominantly dry cough.  No hemoptysis she acknowledges that she could be more active to maintain muscle strength and endurance.  She is scheduled to receive Rituxan in December. She uses a portable oxygen system for travel, initially required after pneumonia related to COVID-19, but currently only uses oxygen at night.  Walk test today in the office shows no desaturations on room air.   Allergies  Allergen Reactions   Oxymorphone     Other reaction(s): Dizziness    Immunization History  Administered Date(s) Administered   Fluad Quad(high Dose 65+) 02/24/2022   Influenza, Seasonal, Injecte, Preservative Fre 02/15/2014   Influenza-Unspecified 02/15/2014, 03/31/2021, 02/16/2023   PFIZER(Purple Top)SARS-COV-2 Vaccination 06/15/2019, 07/11/2019, 02/16/2020   PNEUMOCOCCAL CONJUGATE-20 08/20/2022   Pneumococcal Conjugate-13 02/27/2015   Respiratory Syncytial Virus Vaccine,Recomb Aduvanted(Arexvy) 04/08/2023    Past Medical History:  Diagnosis Date   Abnormality of gait 10/04/2014   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    Chronic kidney disease    II   Decreased ambulation status 09/2014   ' went from ambulating with a cane to being unable to ambulate   Depression    Family history of adverse reaction to anesthesia    difficult to put my mother to sleep    Hypercholesteremia  Hyperlipidemia    past hx   Hyperplastic colon polyp    Hypertension    past hx   Numbness and tingling of both legs    Peripheral neuropathy, 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     albuterol (VENTOLIN HFA) 108 (90 Base) MCG/ACT inhaler Inhale 2 puffs into the lungs every 6 (six) hours as needed.     alendronate (FOSAMAX) 70 MG tablet Take 70 mg by mouth once a week. Take with a full glass of water  on an empty stomach.     amLODipine  (NORVASC ) 5 MG tablet Take 5 mg by mouth daily.     Ascorbic Acid (VITAMIN C) 100 MG tablet Take 100 mg by mouth daily.     cyanocobalamin  (VITAMIN B12) 1000 MCG tablet Take 1,000 mcg by mouth daily.     escitalopram (LEXAPRO) 10 MG tablet      gabapentin  (NEURONTIN ) 300 MG capsule Take 300 mg by mouth 3 (three) times daily.     GEMTESA 75 MG TABS Take 1 tablet by mouth daily.     letrozole (FEMARA) 2.5 MG tablet Take 2.5 mg by mouth daily.     losartan (COZAAR) 50 MG tablet Take 50 mg by mouth daily.     Menaquinone-7 (VITAMIN K2) 100 MCG CAPS Take 1 tablet by mouth daily.     Multiple Vitamin (MULTIVITAMIN) capsule Take 1 capsule by mouth daily.     pramipexole (MIRAPEX) 0.125 MG tablet      riTUXimab in sodium chloride  0.9 % 250 mL Inject into the vein once. every 6 months     rosuvastatin (CRESTOR) 10 MG tablet      TURMERIC PO Take 1 tablet by mouth daily.     Facility-Administered Medications Prior to Visit  Medication Dose Route Frequency Provider Last Rate Last Admin   0.9 %  sodium chloride  infusion  500 mL Intravenous Once Armbruster, Elspeth SQUIBB, MD         Review of Systems:   Constitutional:   No  weight loss, night sweats,  Fevers, chills,+ fatigue, or  lassitude.  HEENT:   No headaches,  Difficulty swallowing,  Tooth/dental problems, or  Sore throat,                No sneezing, itching, ear  ache, nasal congestion, post nasal drip,   CV:  No chest pain,  Orthopnea, PND, swelling in lower extremities, anasarca, dizziness, palpitations, syncope.   GI  No heartburn, indigestion, abdominal pain, nausea, vomiting, diarrhea, change in bowel habits, loss of appetite, bloody stools.   Resp:   No chest wall deformity  Skin: no rash or lesions.  GU: no dysuria, change in color of urine, no urgency or frequency.  No flank pain, no hematuria   MS:  No joint pain or swelling.  No decreased range of motion.  No back pain.    Physical Exam  BP 122/67   Pulse 74   Temp 98.5 F (36.9 C) (Oral)   Ht 5' (1.524 m)   Wt 160 lb (72.6 kg)  SpO2 94%   BMI 31.25 kg/m   GEN: A/Ox3; pleasant , NAD, well nourished    HEENT:  [place]/AT,  NOSE-clear, THROAT-clear, no lesions, no postnasal drip or exudate noted.   NECK:  Supple w/ fair ROM; no JVD; normal carotid impulses w/o bruits; no thyromegaly or nodules palpated; no lymphadenopathy.    RESP  Clear  P & A; w/o, wheezes/ rales/ or rhonchi. no accessory muscle use, no dullness to percussion  CARD:  RRR, no m/r/g, no peripheral edema, pulses intact, no cyanosis or clubbing.  GI:   Soft & nt; nml bowel sounds; no organomegaly or masses detected.   Musco: Warm bil, no deformities or joint swelling noted.   Neuro: alert, no focal deficits noted.    Skin: Warm, no lesions or rashes    Lab Results:  CBC     BNP No results found for: BNP  ProBNP No results found for: PROBNP  Imaging: No results found.  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 Untreated sleep apnea increases risks for hypertension, congestive heart failure, and arrhythmias.   elevate the head of the bed to a 30-degree angle, and avoid sleeping on her back. Consider CPAP therapy going forward.  - discussed how weight can impact sleep and risk for sleep disordered breathing - discussed options to assist with weight loss: combination of diet modification, cardiovascular and strength training exercises   - had an extensive discussion regarding the adverse health consequences related to untreated sleep disordered breathing - specifically discussed the risks for hypertension, coronary artery disease, cardiac dysrhythmias, cerebrovascular disease, and diabetes - lifestyle modification discussed   - discussed how sleep disruption can increase risk of accidents, particularly when driving - safe driving practices were discussed    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   Schedule a follow-up appointment with Dr. Geronimo within the next three months and order a breathing test to assess lung function. Encourage increased physical activity, aiming for 30 minutes daily. Check PFTs on return.  Check 2D echo to rule out underlying pulmonary hypertension.  Vasculitis -continue follow-up with rheumatology and ongoing management  Plan  Patient Instructions  Continue on Oxygen 2l/m At bedtime   Elevate head of bed at 30 degree  Work on healthy weight loss.  Activity as tolerated  Set up for Echo  Follow up with Dr. Geronimo or Derita Michelsen NP in 3 months -ILD slot with PFT           Madelin Stank, NP 02/14/2024

## 2024-03-13 ENCOUNTER — Ambulatory Visit (INDEPENDENT_AMBULATORY_CARE_PROVIDER_SITE_OTHER)

## 2024-03-13 DIAGNOSIS — J849 Interstitial pulmonary disease, unspecified: Secondary | ICD-10-CM

## 2024-03-13 DIAGNOSIS — G4733 Obstructive sleep apnea (adult) (pediatric): Secondary | ICD-10-CM

## 2024-03-13 DIAGNOSIS — J9611 Chronic respiratory failure with hypoxia: Secondary | ICD-10-CM | POA: Diagnosis not present

## 2024-03-13 DIAGNOSIS — R0602 Shortness of breath: Secondary | ICD-10-CM

## 2024-03-13 LAB — ECHOCARDIOGRAM COMPLETE
Area-P 1/2: 3.72 cm2
S' Lateral: 2.21 cm

## 2024-03-16 ENCOUNTER — Ambulatory Visit: Payer: Self-pay | Admitting: Adult Health

## 2024-04-26 ENCOUNTER — Ambulatory Visit (INDEPENDENT_AMBULATORY_CARE_PROVIDER_SITE_OTHER)

## 2024-04-26 DIAGNOSIS — J9611 Chronic respiratory failure with hypoxia: Secondary | ICD-10-CM

## 2024-04-26 LAB — PULMONARY FUNCTION TEST
DL/VA % pred: 106 %
DL/VA: 4.5 ml/min/mmHg/L
DLCO unc % pred: 96 %
DLCO unc: 15.97 ml/min/mmHg
FEF 25-75 Post: 2.08 L/s
FEF 25-75 Pre: 2.12 L/s
FEF2575-%Change-Post: -1 %
FEF2575-%Pred-Post: 150 %
FEF2575-%Pred-Pre: 152 %
FEV1-%Change-Post: 2 %
FEV1-%Pred-Post: 104 %
FEV1-%Pred-Pre: 101 %
FEV1-Post: 1.79 L
FEV1-Pre: 1.74 L
FEV1FVC-%Change-Post: -8 %
FEV1FVC-%Pred-Pre: 128 %
FEV6-%Change-Post: 12 %
FEV6-%Pred-Post: 93 %
FEV6-%Pred-Pre: 83 %
FEV6-Post: 2.04 L
FEV6-Pre: 1.81 L
FEV6FVC-%Pred-Post: 105 %
FEV6FVC-%Pred-Pre: 105 %
FVC-%Change-Post: 12 %
FVC-%Pred-Post: 88 %
FVC-%Pred-Pre: 78 %
FVC-Post: 2.04 L
FVC-Pre: 1.81 L
Post FEV1/FVC ratio: 88 %
Post FEV6/FVC ratio: 100 %
Pre FEV1/FVC ratio: 96 %
Pre FEV6/FVC Ratio: 100 %
RV % pred: 123 %
RV: 2.6 L
TLC % pred: 108 %
TLC: 4.83 L

## 2024-04-26 NOTE — Progress Notes (Signed)
 Full PFT performed today.

## 2024-04-26 NOTE — Patient Instructions (Signed)
 Full PFT performed today.

## 2024-05-03 ENCOUNTER — Encounter: Payer: Self-pay | Admitting: Adult Health

## 2024-05-03 ENCOUNTER — Ambulatory Visit: Admitting: Adult Health

## 2024-05-03 VITALS — BP 124/68 | HR 92 | Ht 60.0 in | Wt 160.0 lb

## 2024-05-03 DIAGNOSIS — J849 Interstitial pulmonary disease, unspecified: Secondary | ICD-10-CM | POA: Diagnosis not present

## 2024-05-03 DIAGNOSIS — I776 Arteritis, unspecified: Secondary | ICD-10-CM

## 2024-05-03 DIAGNOSIS — G4733 Obstructive sleep apnea (adult) (pediatric): Secondary | ICD-10-CM | POA: Diagnosis not present

## 2024-05-03 DIAGNOSIS — R0902 Hypoxemia: Secondary | ICD-10-CM

## 2024-05-03 DIAGNOSIS — J9611 Chronic respiratory failure with hypoxia: Secondary | ICD-10-CM

## 2024-05-03 NOTE — Progress Notes (Unsigned)
 @Patient  ID: Kristie Porter, female    DOB: 07-Mar-1946, 78 y.o.   MRN: [id number]  No chief complaint on file.   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 Has 6 dogs. She does have a chicken coop outside but does not interact with the chickens. She has no basement or hot tub.      TEST/EVENTS : Reviewed 05/03/2024  Hospitalization January 2023 for acute respiratory failure secondary to COVID-19 complicated by multifocal pneumonia   Chest x-ray June 2023 resolved bilateral lower lung patchy airspace opacities with marked improvement in bilateral interstitial thickening.   High-resolution CT chest showed bibasilar coarse and subpleural ground glass densities and traction bronchiolectasis, scarring in the lingula findings are categorized as probable UIP   NPSG January 04, 2024 showed mild sleep apnea with an AHI at 9.4 hours SpO2 low at 79%, time spent less than 88% O2 saturations was 86 minutes (19% of the study)  01/2024 Walk test today in the office shows no desaturations on room air   Echo March 13, 2024 normal EF, RV size normal, RVSF normal  Discussed the use of AI scribe software for clinical note transcription with the patient, who gave verbal consent to proceed.  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    Chronic kidney disease    II   Decreased ambulation status 09/2014   ' went from ambulating with a cane to being unable to ambulate   Depression    Family history of adverse reaction to anesthesia    difficult to put my mother to sleep    Hypercholesteremia    Hyperlipidemia    past hx   Hyperplastic colon polyp    Hypertension    past hx   Numbness and tingling of both legs    Peripheral neuropathy, 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     HYDROcodone -acetaminophen  (NORCO) 10-325 MG tablet Take 1 tablet by mouth every 6 (six) hours as needed.     methocarbamol  (ROBAXIN ) 500 MG tablet Take 500 mg by mouth every 8 (eight) hours as needed.     ondansetron  (ZOFRAN -ODT) 4 MG disintegrating tablet Take 4 mg by mouth every 8 (eight) hours as needed.     rosuvastatin (CRESTOR) 40 MG tablet Take 40 mg by mouth daily.     acetaminophen  (TYLENOL ) 325 MG tablet Take by mouth.  albuterol (VENTOLIN HFA) 108 (90 Base) MCG/ACT inhaler Inhale 2 puffs into the lungs every 6 (six) hours as needed.     alendronate (FOSAMAX) 70 MG tablet Take 70 mg by mouth once a week. Take with a full glass of water  on an empty stomach.     amLODipine  (NORVASC ) 5 MG tablet Take 5 mg by mouth daily.     Ascorbic Acid (VITAMIN C) 100 MG tablet Take 100 mg by mouth daily.     cyanocobalamin  (VITAMIN B12) 1000  MCG tablet Take 1,000 mcg by mouth daily.     escitalopram (LEXAPRO) 10 MG tablet      gabapentin  (NEURONTIN ) 300 MG capsule Take 300 mg by mouth 3 (three) times daily.     GEMTESA 75 MG TABS Take 1 tablet by mouth daily.     letrozole (FEMARA) 2.5 MG tablet Take 2.5 mg by mouth daily.     losartan (COZAAR) 50 MG tablet Take 50 mg by mouth daily.     Menaquinone-7 (VITAMIN K2) 100 MCG CAPS Take 1 tablet by mouth daily.     Multiple Vitamin (MULTIVITAMIN) capsule Take 1 capsule by mouth daily.     pramipexole (MIRAPEX) 0.125 MG tablet      riTUXimab in sodium chloride  0.9 % 250 mL Inject into the vein once. every 6 months     rosuvastatin (CRESTOR) 10 MG tablet      TURMERIC PO Take 1 tablet by mouth daily.     Facility-Administered Medications Prior to Visit  Medication Dose Route Frequency Provider Last Rate Last Admin   0.9 %  sodium chloride  infusion  500 mL Intravenous Once Armbruster, Elspeth SQUIBB, MD         Review of Systems:   Constitutional:   No  weight loss, night sweats,  Fevers, chills, fatigue, or  lassitude.  HEENT:   No headaches,  Difficulty swallowing,  Tooth/dental problems, or  Sore throat,                No sneezing, itching, ear ache, nasal congestion, post nasal drip,   CV:  No chest pain,  Orthopnea, PND, swelling in lower extremities, anasarca, dizziness, palpitations, syncope.   GI  No heartburn, indigestion, abdominal pain, nausea, vomiting, diarrhea, change in bowel habits, loss of appetite, bloody stools.   Resp: No shortness of breath with exertion or at rest.  No excess mucus, no productive cough,  No non-productive cough,  No coughing up of blood.  No change in color of mucus.  No wheezing.  No chest wall deformity  Skin: no rash or lesions.  GU: no dysuria, change in color of urine, no urgency or frequency.  No flank pain, no hematuria   MS:  No joint pain or swelling.  No decreased range of motion.  No back pain.    Physical Exam  There were no  vitals taken for this visit.  GEN: A/Ox3; pleasant , NAD, well nourished    HEENT:  [place]/AT,  EACs-clear, TMs-wnl, NOSE-clear, THROAT-clear, no lesions, no postnasal drip or exudate noted.   NECK:  Supple w/ fair ROM; no JVD; normal carotid impulses w/o bruits; no thyromegaly or nodules palpated; no lymphadenopathy.    RESP  Clear  P & A; w/o, wheezes/ rales/ or rhonchi. no accessory muscle use, no dullness to percussion  CARD:  RRR, no m/r/g, no peripheral edema, pulses intact, no cyanosis or clubbing.  GI:   Soft & nt; nml bowel sounds; no organomegaly or masses  detected.   Musco: Warm bil, no deformities or joint swelling noted.   Neuro: alert, no focal deficits noted.    Skin: Warm, no lesions or rashes    Lab Results:Reviewed 05/03/2024   CBC    Component Value Date/Time   WBC 16.6 (H) 10/04/2014 0935   WBC 7.5 10/01/2014 0506   RBC 4.18 10/04/2014 0935   RBC 3.02 (L) 10/01/2014 0506   HGB 10.6 (L) 10/04/2014 0935   HCT 35.2 10/04/2014 0935   PLT 465 (H) 10/04/2014 0935   MCV 84 10/04/2014 0935   MCH 25.4 (L) 10/04/2014 0935   MCH 25.8 (L) 10/01/2014 0506   MCHC 30.1 (L) 10/04/2014 0935   MCHC 31.1 10/01/2014 0506   RDW 15.9 (H) 10/04/2014 0935   LYMPHSABS 3.2 (H) 10/04/2014 0935   MONOABS 0.4 09/26/2014 1530   EOSABS 0.1 10/04/2014 0935   BASOSABS 0.0 10/04/2014 0935    BMET    Component Value Date/Time   NA 138 10/01/2014 0506   K 3.4 (L) 10/01/2014 0506   CL 106 10/01/2014 0506   CO2 23 10/01/2014 0506   GLUCOSE 150 (H) 10/01/2014 0506   BUN 27 (H) 10/01/2014 0506   CREATININE 0.82 10/01/2014 0506   CALCIUM 8.0 (L) 10/01/2014 0506   GFRNONAA >60 10/01/2014 0506   GFRAA >60 10/01/2014 0506    BNP No results found for: BNP  ProBNP No results found for: PROBNP  Imaging: No results found.  Administration History     None          Latest Ref Rng & Units 04/26/2024    9:30 AM  PFT Results  FVC-Pre L 1.81  P  FVC-Predicted Pre % 78   P  FVC-Post L 2.04  P  FVC-Predicted Post % 88  P  Pre FEV1/FVC % % 96  P  Post FEV1/FCV % % 88  P  FEV1-Pre L 1.74  P  FEV1-Predicted Pre % 101  P  FEV1-Post L 1.79  P  DLCO uncorrected ml/min/mmHg 15.97  P  DLCO UNC% % 96  P  DLVA Predicted % 106  P  TLC L 4.83  P  TLC % Predicted % 108  P  RV % Predicted % 123  P    P Preliminary result    Lab Results  Component Value Date   NITRICOXIDE 24 12/26/2020       06/13/2021   10:54 AM  Results of the [facility] flowsheet  Sitting and reading 2  Watching TV 2  Sitting, inactive in a public place (e.g. a theatre or a meeting) 2  As a passenger in a car for an hour without a break 2  Lying down to rest in the afternoon when circumstances permit 3  Sitting and talking to someone 0  Sitting quietly after a lunch without alcohol 1  In a car, while stopped for a few minutes in traffic 1  Total score 13        Assessment & Plan:   Assessment and Plan Assessment & Plan         Mckyle Solanki, NP 05/03/2024  I spent   minutes dedicated to the care of this patient on the date of this encounter to include pre-visit review of records, face-to-face time with the patient discussing conditions above, post visit ordering of testing, clinical documentation with the electronic health record, making appropriate referrals as documented, and communicating necessary findings to members of the patients care team.      [  1]  Allergies Allergen Reactions   Oxymorphone     Other reaction(s): Dizziness  [2]  Social History Tobacco Use  Smoking Status Never   Passive exposure: Never  Smokeless Tobacco Never

## 2024-05-03 NOTE — Patient Instructions (Signed)
 Continue on Oxygen 2l/m At bedtime   Elevate head of bed at 30 degrees, avoid sleeping on your back.  Work on healthy weight loss.  Activity as tolerated  Follow up with Dr. Geronimo or Chaniyah Jahr NP in 6months -ILD slot with PFT -Spirometry with DLCO

## 2024-11-02 ENCOUNTER — Ambulatory Visit: Admitting: Internal Medicine

## 2024-11-02 ENCOUNTER — Encounter
